# Patient Record
Sex: Male | Born: 1940 | Race: White | Hispanic: No | Marital: Married | Smoking: Former smoker
Health system: Southern US, Community
[De-identification: ages and names within clinical notes are randomized; demographics above are authoritative.]

## PROBLEM LIST (undated history)

## (undated) DIAGNOSIS — I10 Essential (primary) hypertension: Secondary | ICD-10-CM

## (undated) DIAGNOSIS — N320 Bladder-neck obstruction: Secondary | ICD-10-CM

## (undated) DIAGNOSIS — K219 Gastro-esophageal reflux disease without esophagitis: Secondary | ICD-10-CM

## (undated) DIAGNOSIS — I739 Peripheral vascular disease, unspecified: Secondary | ICD-10-CM

## (undated) DIAGNOSIS — J45909 Unspecified asthma, uncomplicated: Secondary | ICD-10-CM

## (undated) DIAGNOSIS — Z9889 Other specified postprocedural states: Secondary | ICD-10-CM

## (undated) DIAGNOSIS — N183 Chronic kidney disease, stage 3 unspecified: Secondary | ICD-10-CM

## (undated) DIAGNOSIS — I429 Cardiomyopathy, unspecified: Secondary | ICD-10-CM

## (undated) DIAGNOSIS — C159 Malignant neoplasm of esophagus, unspecified: Secondary | ICD-10-CM

## (undated) DIAGNOSIS — E119 Type 2 diabetes mellitus without complications: Secondary | ICD-10-CM

## (undated) DIAGNOSIS — D649 Anemia, unspecified: Secondary | ICD-10-CM

## (undated) DIAGNOSIS — M109 Gout, unspecified: Secondary | ICD-10-CM

## (undated) DIAGNOSIS — Z515 Encounter for palliative care: Secondary | ICD-10-CM

## (undated) DIAGNOSIS — E785 Hyperlipidemia, unspecified: Secondary | ICD-10-CM

## (undated) DIAGNOSIS — Z889 Allergy status to unspecified drugs, medicaments and biological substances status: Secondary | ICD-10-CM

## (undated) HISTORY — DX: Anemia, unspecified: D64.9

## (undated) HISTORY — DX: Allergy status to unspecified drugs, medicaments and biological substances: Z88.9

## (undated) HISTORY — DX: Malignant neoplasm of esophagus, unspecified: C15.9

## (undated) HISTORY — DX: Bladder-neck obstruction: N32.0

## (undated) HISTORY — DX: Peripheral vascular disease, unspecified: I73.9

## (undated) HISTORY — DX: Chronic kidney disease, stage 3 (moderate): N18.3

## (undated) HISTORY — DX: Hyperlipidemia, unspecified: E78.5

## (undated) HISTORY — DX: Other specified postprocedural states: Z98.890

## (undated) HISTORY — DX: Encounter for palliative care: Z51.5

## (undated) HISTORY — DX: Cardiomyopathy, unspecified: I42.9

## (undated) HISTORY — DX: Type 2 diabetes mellitus without complications: E11.9

## (undated) HISTORY — DX: Chronic kidney disease, stage 3 unspecified: N18.30

---

## 1963-07-25 HISTORY — PX: OTHER SURGICAL HISTORY: SHX169

## 1996-07-24 HISTORY — PX: ABDOMINAL AORTIC ANEURYSM REPAIR: SUR1152

## 2000-07-24 HISTORY — PX: HERNIA REPAIR: SHX51

## 2005-04-27 ENCOUNTER — Ambulatory Visit: Payer: Self-pay | Admitting: Internal Medicine

## 2006-07-24 HISTORY — PX: CATARACT EXTRACTION: SUR2

## 2010-12-01 ENCOUNTER — Ambulatory Visit: Payer: Self-pay | Admitting: Gastroenterology

## 2010-12-05 LAB — PATHOLOGY REPORT

## 2011-02-07 ENCOUNTER — Ambulatory Visit: Payer: Self-pay | Admitting: General Surgery

## 2011-02-09 ENCOUNTER — Other Ambulatory Visit: Payer: Self-pay | Admitting: General Surgery

## 2011-02-16 ENCOUNTER — Ambulatory Visit: Payer: Self-pay | Admitting: General Surgery

## 2011-02-21 ENCOUNTER — Ambulatory Visit: Payer: Self-pay | Admitting: General Surgery

## 2011-07-25 LAB — HM COLONOSCOPY

## 2011-08-25 ENCOUNTER — Ambulatory Visit: Payer: Self-pay | Admitting: General Surgery

## 2011-08-28 LAB — PATHOLOGY REPORT

## 2011-12-27 ENCOUNTER — Ambulatory Visit: Payer: Self-pay | Admitting: Nephrology

## 2012-01-16 DIAGNOSIS — C159 Malignant neoplasm of esophagus, unspecified: Secondary | ICD-10-CM | POA: Insufficient documentation

## 2012-01-17 ENCOUNTER — Ambulatory Visit: Payer: Self-pay | Admitting: General Surgery

## 2012-01-18 LAB — PATHOLOGY REPORT

## 2012-01-19 ENCOUNTER — Ambulatory Visit: Payer: Self-pay | Admitting: Oncology

## 2012-01-26 ENCOUNTER — Ambulatory Visit: Payer: Self-pay | Admitting: General Surgery

## 2012-01-29 ENCOUNTER — Ambulatory Visit: Payer: Self-pay | Admitting: Oncology

## 2012-01-29 LAB — CBC CANCER CENTER
Basophil #: 0.1 x10 3/mm (ref 0.0–0.1)
Basophil %: 0.6 %
HCT: 42.4 % (ref 40.0–52.0)
HGB: 14.3 g/dL (ref 13.0–18.0)
Lymphocyte #: 1 x10 3/mm (ref 1.0–3.6)
Lymphocyte %: 11.3 %
MCHC: 33.7 g/dL (ref 32.0–36.0)
MCV: 82 fL (ref 80–100)
Monocyte #: 0.9 x10 3/mm (ref 0.2–1.0)
Monocyte %: 9.7 %
Neutrophil #: 7 x10 3/mm — ABNORMAL HIGH (ref 1.4–6.5)
RDW: 16 % — ABNORMAL HIGH (ref 11.5–14.5)
WBC: 9.2 x10 3/mm (ref 3.8–10.6)

## 2012-01-29 LAB — COMPREHENSIVE METABOLIC PANEL
Alkaline Phosphatase: 106 U/L (ref 50–136)
Calcium, Total: 9.8 mg/dL (ref 8.5–10.1)
Chloride: 103 mmol/L (ref 98–107)
Co2: 27 mmol/L (ref 21–32)
EGFR (African American): 49 — ABNORMAL LOW
EGFR (Non-African Amer.): 42 — ABNORMAL LOW
Osmolality: 282 (ref 275–301)
SGOT(AST): 34 U/L (ref 15–37)
SGPT (ALT): 49 U/L
Sodium: 139 mmol/L (ref 136–145)

## 2012-01-29 LAB — PROTIME-INR: INR: 0.9

## 2012-01-30 ENCOUNTER — Telehealth: Payer: Self-pay | Admitting: Gastroenterology

## 2012-01-30 DIAGNOSIS — C159 Malignant neoplasm of esophagus, unspecified: Secondary | ICD-10-CM

## 2012-01-30 NOTE — Telephone Encounter (Signed)
Pt to be scheduled for 02/15/12 message left with Dahlia Client to be sure that date will work.  Dr Christella Hartigan is out of the office next week

## 2012-01-30 NOTE — Telephone Encounter (Signed)
Jesse Huynh states the 25 th will be fine for the procedure I will get this scheduled and call the pt

## 2012-01-31 ENCOUNTER — Other Ambulatory Visit: Payer: Self-pay

## 2012-01-31 NOTE — Telephone Encounter (Signed)
Unable to reach pt home number is not accepting calls and no answer on the cell phone

## 2012-01-31 NOTE — Addendum Note (Signed)
Addended by: Donata Duff on: 01/31/2012 11:24 AM   Modules accepted: Orders

## 2012-02-01 NOTE — Telephone Encounter (Signed)
Jesse Huynh gave me an alternate number to use [phone])

## 2012-02-01 NOTE — Telephone Encounter (Signed)
No answer instructions have been mailed

## 2012-02-01 NOTE — Telephone Encounter (Signed)
Pt has been notified and meds reviewed phone number was corrected in EPIC as well as address

## 2012-02-15 ENCOUNTER — Ambulatory Visit (HOSPITAL_COMMUNITY)
Admission: RE | Admit: 2012-02-15 | Discharge: 2012-02-15 | Disposition: A | Discharge status: Home / Self Care | Payer: Medicare Other | Source: Ambulatory Visit | Attending: Gastroenterology | Admitting: Gastroenterology

## 2012-02-15 ENCOUNTER — Encounter (HOSPITAL_COMMUNITY): Payer: Self-pay | Admitting: Gastroenterology

## 2012-02-15 ENCOUNTER — Encounter (HOSPITAL_COMMUNITY)
Admission: RE | Disposition: A | Discharge status: Home / Self Care | Payer: Self-pay | Source: Ambulatory Visit | Attending: Gastroenterology

## 2012-02-15 DIAGNOSIS — C159 Malignant neoplasm of esophagus, unspecified: Secondary | ICD-10-CM

## 2012-02-15 DIAGNOSIS — C153 Malignant neoplasm of upper third of esophagus: Secondary | ICD-10-CM | POA: Insufficient documentation

## 2012-02-15 DIAGNOSIS — E119 Type 2 diabetes mellitus without complications: Secondary | ICD-10-CM | POA: Insufficient documentation

## 2012-02-15 DIAGNOSIS — I10 Essential (primary) hypertension: Secondary | ICD-10-CM | POA: Insufficient documentation

## 2012-02-15 DIAGNOSIS — K219 Gastro-esophageal reflux disease without esophagitis: Secondary | ICD-10-CM | POA: Insufficient documentation

## 2012-02-15 HISTORY — DX: Unspecified asthma, uncomplicated: J45.909

## 2012-02-15 HISTORY — DX: Essential (primary) hypertension: I10

## 2012-02-15 HISTORY — PX: EUS: SHX5427

## 2012-02-15 HISTORY — DX: Gout, unspecified: M10.9

## 2012-02-15 HISTORY — DX: Gastro-esophageal reflux disease without esophagitis: K21.9

## 2012-02-15 SURGERY — UPPER ENDOSCOPIC ULTRASOUND (EUS) RADIAL
Anesthesia: Moderate Sedation

## 2012-02-15 MED ORDER — FENTANYL CITRATE 0.05 MG/ML IJ SOLN
INTRAMUSCULAR | Status: AC
  Filled 2012-02-15: qty 4

## 2012-02-15 MED ORDER — MIDAZOLAM HCL 10 MG/2ML IJ SOLN
INTRAMUSCULAR | Status: AC
  Filled 2012-02-15: qty 4

## 2012-02-15 MED ORDER — MIDAZOLAM HCL 10 MG/2ML IJ SOLN
INTRAMUSCULAR | Status: DC | PRN
  Administered 2012-02-15 (×4): 2 mg via INTRAVENOUS

## 2012-02-15 MED ORDER — SODIUM CHLORIDE 0.9 % IV SOLN
INTRAVENOUS | Status: DC
  Administered 2012-02-15: 500 mL via INTRAVENOUS

## 2012-02-15 MED ORDER — FENTANYL CITRATE 0.05 MG/ML IJ SOLN
INTRAMUSCULAR | Status: DC | PRN
  Administered 2012-02-15 (×4): 25 ug via INTRAVENOUS

## 2012-02-15 MED ORDER — BUTAMBEN-TETRACAINE-BENZOCAINE 2-2-14 % EX AERO
INHALATION_SPRAY | CUTANEOUS | Status: DC | PRN
  Administered 2012-02-15: 2 via TOPICAL

## 2012-02-15 MED ORDER — DIPHENHYDRAMINE HCL 50 MG/ML IJ SOLN
INTRAMUSCULAR | Status: AC
  Filled 2012-02-15: qty 1

## 2012-02-15 NOTE — Op Note (Signed)
[facility] [address] [place], [place]  [address]  ENDOSCOPIC ULTRASOUND PROCEDURE REPORT  PATIENT:  Jesse Huynh, Jesse Huynh  MR#:  [id number] BIRTHDATE:  08/04/1940  GENDER:  male ENDOSCOPIST:  Rachael Fee, MD REFERRED BY:  Sallee Lange, M.D. 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 The patient was then placed in the left, lateral, decubitus postion and IV sedation was administered. Throughout the procedure, the patient's blood pressure, pulse and oxygen saturations were monitored continuously.  Under direct visualization, the [id number] endoscope was introduced through the mouth and advanced to the second portion of the duodenum.  Water was used as necessary to provide an acoustic interface.  Upon completion of the imaging, water was removed and the patient was sent to the recovery room in satisfactory condition. <<PROCEDUREIMAGES>> Endoscopic findings: 1. The mucosa in proximal esophagus was  circumferentially friable, and slightly ulcerated from 18cm to 20cm from incisors however there was no discrete mass lesion. 2. The esophagus and stomach were otherwise normal.  EUS findings: 1. The wall of the esophgus at the level of the abnormal mucosa above was normal by EUS.  There were no clear masses (uT1 at most) 2. Adjacent to the abnormal mucosa there was a 6.83mm, round, discrete, homogeneous, hypochoic lymphnode that was suspicious for malignant invovlement (uN1). 3. No other mediastinal, paraesophageal or celiac adenopathy.  Impression: uT1(at most)N1 proximal esophageal squamous cell cancer located from 18-20cm from incisors.  The  single suspicious paraesophageal lymphnode is directly adjacent the the primary tumor.  ______________________________ Rachael Fee, MD  n. eSIGNED:   Rachael Fee at 02/15/2012 12:31 PM  Neva Seat, [phone]

## 2012-02-15 NOTE — H&P (Signed)
  HPI: This is a man recently diagnosed with proximal esophageal squamous cancer.  PET shows no sign of metastatic disease    Past Medical History  Diagnosis Date  . Hypertension   . Asthma   . Diabetes mellitus   . GERD (gastroesophageal reflux disease)   . Cancer 01-16-2012  . Gout   . High cholesterol     Past Surgical History  Procedure Date  . Abdominal aortic aneurysm repair 1998  . Hernia repair 2012    abdmoninal  . Knee sx 1965    left    Current Facility-Administered Medications  Medication Dose Route Frequency Provider Last Rate Last Dose  . 0.9 %  sodium chloride infusion   Intravenous Continuous Rachael Fee, MD 20 mL/hr at 02/15/12 1146 500 mL at 02/15/12 1146    Allergies as of 01/31/2012  . (Not on File)    History reviewed. No pertinent family history.  History   Social History  . Marital Status: Married    Spouse Name: N/A    Number of Children: N/A  . Years of Education: N/A   Occupational History  . Not on file.   Social History Main Topics  . Smoking status: Former Games developer  . Smokeless tobacco: Not on file  . Alcohol Use: No  . Drug Use: No  . Sexually Active:    Other Topics Concern  . Not on file   Social History Narrative  . No narrative on file      Physical Exam: BP 137/78  Pulse 75  Temp 97.8 F (36.6 C) (Oral)  Resp 25  Ht 6' (1.829 m)  Wt 186 lb (84.369 kg)  BMI 25.23 kg/m2  SpO2 95% Constitutional: generally well-appearing Psychiatric: alert and oriented x3 Abdomen: soft, nontender, nondistended, no obvious ascites, no peritoneal signs, normal bowel sounds     Assessment and plan: 71 y.o. male with esophaegal cancer  For EUS staging today

## 2012-02-16 ENCOUNTER — Encounter (HOSPITAL_COMMUNITY): Payer: Self-pay

## 2012-02-16 ENCOUNTER — Encounter (HOSPITAL_COMMUNITY): Payer: Self-pay | Admitting: Gastroenterology

## 2012-02-21 ENCOUNTER — Ambulatory Visit: Payer: Self-pay | Admitting: General Surgery

## 2012-02-22 ENCOUNTER — Ambulatory Visit: Payer: Self-pay | Admitting: General Surgery

## 2012-02-22 ENCOUNTER — Ambulatory Visit: Payer: Self-pay | Admitting: Oncology

## 2012-02-23 ENCOUNTER — Ambulatory Visit: Payer: Self-pay | Admitting: Oncology

## 2012-02-26 LAB — COMPREHENSIVE METABOLIC PANEL
Albumin: 3.3 g/dL — ABNORMAL LOW (ref 3.4–5.0)
Alkaline Phosphatase: 132 U/L (ref 50–136)
Anion Gap: 13 (ref 7–16)
BUN: 19 mg/dL — ABNORMAL HIGH (ref 7–18)
Bilirubin,Total: 0.3 mg/dL (ref 0.2–1.0)
Chloride: 101 mmol/L (ref 98–107)
Creatinine: 1.49 mg/dL — ABNORMAL HIGH (ref 0.60–1.30)
EGFR (African American): 54 — ABNORMAL LOW
Glucose: 253 mg/dL — ABNORMAL HIGH (ref 65–99)
SGOT(AST): 25 U/L (ref 15–37)
SGPT (ALT): 45 U/L (ref 12–78)
Sodium: 137 mmol/L (ref 136–145)
Total Protein: 7.1 g/dL (ref 6.4–8.2)

## 2012-02-26 LAB — CBC CANCER CENTER
Basophil %: 0.4 %
Eosinophil #: 0.2 x10 3/mm (ref 0.0–0.7)
HCT: 37.1 % — ABNORMAL LOW (ref 40.0–52.0)
HGB: 12.4 g/dL — ABNORMAL LOW (ref 13.0–18.0)
Lymphocyte %: 10 %
MCHC: 33.5 g/dL (ref 32.0–36.0)
MCV: 83 fL (ref 80–100)
Monocyte %: 9.2 %
Neutrophil #: 6.5 x10 3/mm (ref 1.4–6.5)
Neutrophil %: 78 %
RDW: 15.2 % — ABNORMAL HIGH (ref 11.5–14.5)

## 2012-03-04 LAB — COMPREHENSIVE METABOLIC PANEL
Albumin: 3.5 g/dL (ref 3.4–5.0)
Alkaline Phosphatase: 102 U/L (ref 50–136)
BUN: 37 mg/dL — ABNORMAL HIGH (ref 7–18)
Bilirubin,Total: 0.3 mg/dL (ref 0.2–1.0)
Calcium, Total: 9.3 mg/dL (ref 8.5–10.1)
Co2: 24 mmol/L (ref 21–32)
Creatinine: 1.93 mg/dL — ABNORMAL HIGH (ref 0.60–1.30)
EGFR (African American): 39 — ABNORMAL LOW
EGFR (Non-African Amer.): 34 — ABNORMAL LOW
Glucose: 253 mg/dL — ABNORMAL HIGH (ref 65–99)
Osmolality: 293 (ref 275–301)
Potassium: 4.5 mmol/L (ref 3.5–5.1)
SGPT (ALT): 47 U/L (ref 12–78)
Total Protein: 7.3 g/dL (ref 6.4–8.2)

## 2012-03-04 LAB — CBC CANCER CENTER
Eosinophil #: 0.1 x10 3/mm (ref 0.0–0.7)
Eosinophil %: 1.6 %
HCT: 37 % — ABNORMAL LOW (ref 40.0–52.0)
Lymphocyte #: 0.7 x10 3/mm — ABNORMAL LOW (ref 1.0–3.6)
MCHC: 33.6 g/dL (ref 32.0–36.0)
MCV: 84 fL (ref 80–100)
Monocyte #: 0.6 x10 3/mm (ref 0.2–1.0)
Monocyte %: 8 %
Neutrophil #: 5.6 x10 3/mm (ref 1.4–6.5)
Platelet: 135 x10 3/mm — ABNORMAL LOW (ref 150–440)
RBC: 4.43 10*6/uL (ref 4.40–5.90)
RDW: 15 % — ABNORMAL HIGH (ref 11.5–14.5)
WBC: 7 x10 3/mm (ref 3.8–10.6)

## 2012-03-11 LAB — CBC CANCER CENTER
Basophil #: 0 x10 3/mm (ref 0.0–0.1)
Eosinophil #: 0.1 x10 3/mm (ref 0.0–0.7)
HCT: 36.4 % — ABNORMAL LOW (ref 40.0–52.0)
HGB: 11.9 g/dL — ABNORMAL LOW (ref 13.0–18.0)
Lymphocyte #: 0.4 x10 3/mm — ABNORMAL LOW (ref 1.0–3.6)
MCHC: 32.7 g/dL (ref 32.0–36.0)
MCV: 84 fL (ref 80–100)
Monocyte %: 7.9 %
Neutrophil #: 4.7 x10 3/mm (ref 1.4–6.5)
Neutrophil %: 83.5 %
RDW: 14.7 % — ABNORMAL HIGH (ref 11.5–14.5)
WBC: 5.7 x10 3/mm (ref 3.8–10.6)

## 2012-03-11 LAB — COMPREHENSIVE METABOLIC PANEL
Albumin: 3.4 g/dL (ref 3.4–5.0)
Anion Gap: 8 (ref 7–16)
BUN: 27 mg/dL — ABNORMAL HIGH (ref 7–18)
Bilirubin,Total: 0.4 mg/dL (ref 0.2–1.0)
Chloride: 108 mmol/L — ABNORMAL HIGH (ref 98–107)
Creatinine: 1.56 mg/dL — ABNORMAL HIGH (ref 0.60–1.30)
EGFR (African American): 51 — ABNORMAL LOW
EGFR (Non-African Amer.): 44 — ABNORMAL LOW
Glucose: 230 mg/dL — ABNORMAL HIGH (ref 65–99)
Osmolality: 292 (ref 275–301)
Potassium: 4.2 mmol/L (ref 3.5–5.1)
SGOT(AST): 30 U/L (ref 15–37)
SGPT (ALT): 38 U/L (ref 12–78)
Sodium: 140 mmol/L (ref 136–145)
Total Protein: 7.3 g/dL (ref 6.4–8.2)

## 2012-03-18 LAB — CBC CANCER CENTER
Basophil %: 0.4 %
Eosinophil #: 0 x10 3/mm (ref 0.0–0.7)
HGB: 11.2 g/dL — ABNORMAL LOW (ref 13.0–18.0)
MCH: 27.5 pg (ref 26.0–34.0)
MCHC: 32.7 g/dL (ref 32.0–36.0)
Monocyte #: 0.5 x10 3/mm (ref 0.2–1.0)
Neutrophil %: 83.4 %
Platelet: 86 x10 3/mm — ABNORMAL LOW (ref 150–440)
RDW: 15.2 % — ABNORMAL HIGH (ref 11.5–14.5)

## 2012-03-18 LAB — COMPREHENSIVE METABOLIC PANEL
Alkaline Phosphatase: 98 U/L (ref 50–136)
Anion Gap: 11 (ref 7–16)
BUN: 28 mg/dL — ABNORMAL HIGH (ref 7–18)
Calcium, Total: 9 mg/dL (ref 8.5–10.1)
Co2: 24 mmol/L (ref 21–32)
EGFR (Non-African Amer.): 38 — ABNORMAL LOW
Glucose: 274 mg/dL — ABNORMAL HIGH (ref 65–99)
Osmolality: 291 (ref 275–301)
Sodium: 138 mmol/L (ref 136–145)

## 2012-03-24 ENCOUNTER — Ambulatory Visit: Payer: Self-pay | Admitting: Oncology

## 2012-03-26 LAB — CBC CANCER CENTER
Basophil #: 0 x10 3/mm (ref 0.0–0.1)
Basophil %: 0.4 %
Eosinophil #: 0 x10 3/mm (ref 0.0–0.7)
Eosinophil %: 0.5 %
HCT: 33.2 % — ABNORMAL LOW (ref 40.0–52.0)
Lymphocyte #: 0.3 x10 3/mm — ABNORMAL LOW (ref 1.0–3.6)
Lymphocyte %: 4.7 %
MCH: 27.4 pg (ref 26.0–34.0)
MCHC: 32.5 g/dL (ref 32.0–36.0)
Monocyte #: 0.6 x10 3/mm (ref 0.2–1.0)
Monocyte %: 9.8 %
Neutrophil #: 5 x10 3/mm (ref 1.4–6.5)
Neutrophil %: 84.6 %
Platelet: 60 x10 3/mm — ABNORMAL LOW (ref 150–440)
RDW: 16.6 % — ABNORMAL HIGH (ref 11.5–14.5)

## 2012-03-26 LAB — COMPREHENSIVE METABOLIC PANEL
Alkaline Phosphatase: 94 U/L (ref 50–136)
Bilirubin,Total: 0.3 mg/dL (ref 0.2–1.0)
Co2: 23 mmol/L (ref 21–32)
Creatinine: 1.71 mg/dL — ABNORMAL HIGH (ref 0.60–1.30)
EGFR (Non-African Amer.): 39 — ABNORMAL LOW
Osmolality: 288 (ref 275–301)
Potassium: 4.5 mmol/L (ref 3.5–5.1)
SGPT (ALT): 31 U/L (ref 12–78)
Sodium: 139 mmol/L (ref 136–145)
Total Protein: 6.9 g/dL (ref 6.4–8.2)

## 2012-04-01 LAB — CBC CANCER CENTER
Basophil #: 0 x10 3/mm (ref 0.0–0.1)
Basophil %: 0.3 %
Eosinophil %: 1.1 %
HCT: 31.1 % — ABNORMAL LOW (ref 40.0–52.0)
HGB: 10.3 g/dL — ABNORMAL LOW (ref 13.0–18.0)
Lymphocyte #: 0.2 x10 3/mm — ABNORMAL LOW (ref 1.0–3.6)
Lymphocyte %: 4 %
MCV: 85 fL (ref 80–100)
Monocyte %: 9.9 %
Neutrophil #: 4.3 x10 3/mm (ref 1.4–6.5)
Neutrophil %: 84.7 %
Platelet: 56 x10 3/mm — ABNORMAL LOW (ref 150–440)
RBC: 3.68 10*6/uL — ABNORMAL LOW (ref 4.40–5.90)
RDW: 17.6 % — ABNORMAL HIGH (ref 11.5–14.5)

## 2012-04-01 LAB — COMPREHENSIVE METABOLIC PANEL
Albumin: 3.2 g/dL — ABNORMAL LOW (ref 3.4–5.0)
Anion Gap: 10 (ref 7–16)
Bilirubin,Total: 0.3 mg/dL (ref 0.2–1.0)
Calcium, Total: 8.7 mg/dL (ref 8.5–10.1)
Co2: 23 mmol/L (ref 21–32)
EGFR (Non-African Amer.): 41 — ABNORMAL LOW
Osmolality: 287 (ref 275–301)
Potassium: 4.1 mmol/L (ref 3.5–5.1)
SGOT(AST): 21 U/L (ref 15–37)
Sodium: 138 mmol/L (ref 136–145)

## 2012-04-08 LAB — CBC CANCER CENTER
Basophil %: 0.5 %
Eosinophil %: 2.5 %
HGB: 10.2 g/dL — ABNORMAL LOW (ref 13.0–18.0)
Lymphocyte #: 0.3 x10 3/mm — ABNORMAL LOW (ref 1.0–3.6)
MCH: 28.4 pg (ref 26.0–34.0)
Monocyte #: 0.6 x10 3/mm (ref 0.2–1.0)
Monocyte %: 13.2 %
Neutrophil #: 3.3 x10 3/mm (ref 1.4–6.5)
Platelet: 59 x10 3/mm — ABNORMAL LOW (ref 150–440)
RBC: 3.6 10*6/uL — ABNORMAL LOW (ref 4.40–5.90)
WBC: 4.3 x10 3/mm (ref 3.8–10.6)

## 2012-04-08 LAB — COMPREHENSIVE METABOLIC PANEL
Alkaline Phosphatase: 93 U/L (ref 50–136)
Bilirubin,Total: 0.3 mg/dL (ref 0.2–1.0)
Calcium, Total: 8.7 mg/dL (ref 8.5–10.1)
Chloride: 103 mmol/L (ref 98–107)
Co2: 26 mmol/L (ref 21–32)
Creatinine: 1.63 mg/dL — ABNORMAL HIGH (ref 0.60–1.30)
Osmolality: 289 (ref 275–301)
Potassium: 4 mmol/L (ref 3.5–5.1)
Sodium: 139 mmol/L (ref 136–145)

## 2012-04-15 LAB — COMPREHENSIVE METABOLIC PANEL
Anion Gap: 8 (ref 7–16)
Bilirubin,Total: 0.3 mg/dL (ref 0.2–1.0)
Calcium, Total: 9 mg/dL (ref 8.5–10.1)
Creatinine: 1.56 mg/dL — ABNORMAL HIGH (ref 0.60–1.30)
EGFR (African American): 51 — ABNORMAL LOW
EGFR (Non-African Amer.): 44 — ABNORMAL LOW
Glucose: 272 mg/dL — ABNORMAL HIGH (ref 65–99)
Potassium: 4 mmol/L (ref 3.5–5.1)
Sodium: 136 mmol/L (ref 136–145)
Total Protein: 6.4 g/dL (ref 6.4–8.2)

## 2012-04-15 LAB — CBC CANCER CENTER
Basophil #: 0 x10 3/mm (ref 0.0–0.1)
Basophil %: 0.1 %
Eosinophil #: 0.1 x10 3/mm (ref 0.0–0.7)
Eosinophil %: 1 %
HCT: 31.3 % — ABNORMAL LOW (ref 40.0–52.0)
HGB: 10.4 g/dL — ABNORMAL LOW (ref 13.0–18.0)
Lymphocyte #: 0.2 x10 3/mm — ABNORMAL LOW (ref 1.0–3.6)
Lymphocyte %: 4.2 %
MCH: 28.7 pg (ref 26.0–34.0)
MCV: 86 fL (ref 80–100)
Monocyte #: 0.4 x10 3/mm (ref 0.2–1.0)
RBC: 3.63 10*6/uL — ABNORMAL LOW (ref 4.40–5.90)
WBC: 5.1 x10 3/mm (ref 3.8–10.6)

## 2012-04-22 LAB — CBC CANCER CENTER
Basophil #: 0 x10 3/mm (ref 0.0–0.1)
Basophil %: 0.3 %
Eosinophil #: 0 x10 3/mm (ref 0.0–0.7)
Eosinophil %: 0.7 %
HGB: 10.5 g/dL — ABNORMAL LOW (ref 13.0–18.0)
Lymphocyte #: 0.2 x10 3/mm — ABNORMAL LOW (ref 1.0–3.6)
Lymphocyte %: 4.5 %
MCH: 28.7 pg (ref 26.0–34.0)
MCV: 87 fL (ref 80–100)
Monocyte #: 0.5 x10 3/mm (ref 0.2–1.0)
Monocyte %: 11 %
Platelet: 90 x10 3/mm — ABNORMAL LOW (ref 150–440)
RBC: 3.64 10*6/uL — ABNORMAL LOW (ref 4.40–5.90)
WBC: 4.4 x10 3/mm (ref 3.8–10.6)

## 2012-04-22 LAB — COMPREHENSIVE METABOLIC PANEL
Albumin: 3.3 g/dL — ABNORMAL LOW (ref 3.4–5.0)
BUN: 21 mg/dL — ABNORMAL HIGH (ref 7–18)
Calcium, Total: 9.2 mg/dL (ref 8.5–10.1)
Chloride: 102 mmol/L (ref 98–107)
Co2: 22 mmol/L (ref 21–32)
EGFR (African American): 53 — ABNORMAL LOW
Glucose: 265 mg/dL — ABNORMAL HIGH (ref 65–99)
Potassium: 4.4 mmol/L (ref 3.5–5.1)
SGOT(AST): 21 U/L (ref 15–37)
SGPT (ALT): 33 U/L (ref 12–78)
Sodium: 138 mmol/L (ref 136–145)
Total Protein: 6.7 g/dL (ref 6.4–8.2)

## 2012-04-23 ENCOUNTER — Ambulatory Visit: Payer: Self-pay | Admitting: Oncology

## 2012-05-22 LAB — CBC CANCER CENTER
Basophil #: 0 x10 3/mm (ref 0.0–0.1)
Basophil %: 0.4 %
Eosinophil %: 2.6 %
HCT: 31.5 % — ABNORMAL LOW (ref 40.0–52.0)
Lymphocyte #: 0.4 x10 3/mm — ABNORMAL LOW (ref 1.0–3.6)
Lymphocyte %: 6 %
MCH: 30.4 pg (ref 26.0–34.0)
MCV: 89 fL (ref 80–100)
Monocyte #: 0.6 x10 3/mm (ref 0.2–1.0)
Monocyte %: 10 %
Platelet: 74 x10 3/mm — ABNORMAL LOW (ref 150–440)
RBC: 3.53 10*6/uL — ABNORMAL LOW (ref 4.40–5.90)
WBC: 6.4 x10 3/mm (ref 3.8–10.6)

## 2012-05-24 ENCOUNTER — Ambulatory Visit: Payer: Self-pay | Admitting: Oncology

## 2012-06-19 ENCOUNTER — Ambulatory Visit: Payer: Self-pay | Admitting: General Surgery

## 2012-06-24 ENCOUNTER — Ambulatory Visit: Payer: Self-pay | Admitting: Oncology

## 2012-06-26 ENCOUNTER — Ambulatory Visit: Payer: Self-pay | Admitting: Oncology

## 2012-06-26 LAB — CBC CANCER CENTER
Basophil #: 0 x10 3/mm (ref 0.0–0.1)
Eosinophil #: 0.2 x10 3/mm (ref 0.0–0.7)
Eosinophil %: 2.9 %
HGB: 11.9 g/dL — ABNORMAL LOW (ref 13.0–18.0)
Lymphocyte %: 5.6 %
MCH: 30.9 pg (ref 26.0–34.0)
MCHC: 34.6 g/dL (ref 32.0–36.0)
Monocyte #: 0.7 x10 3/mm (ref 0.2–1.0)
Neutrophil %: 81.1 %
Platelet: 92 x10 3/mm — ABNORMAL LOW (ref 150–440)

## 2012-07-24 ENCOUNTER — Ambulatory Visit: Payer: Self-pay | Admitting: Oncology

## 2012-08-13 LAB — BASIC METABOLIC PANEL: Creat: 1.4

## 2012-08-13 LAB — CBC: platelet count: 110

## 2012-08-24 ENCOUNTER — Ambulatory Visit: Payer: Self-pay | Admitting: Oncology

## 2012-09-21 ENCOUNTER — Ambulatory Visit: Payer: Self-pay | Admitting: Oncology

## 2012-10-02 ENCOUNTER — Ambulatory Visit: Payer: Self-pay | Admitting: Oncology

## 2012-10-22 ENCOUNTER — Ambulatory Visit: Payer: Self-pay | Admitting: Oncology

## 2012-10-23 LAB — CBC CANCER CENTER
Basophil #: 0.1 x10 3/mm (ref 0.0–0.1)
Basophil %: 0.8 %
Eosinophil %: 2.6 %
Lymphocyte #: 0.6 x10 3/mm — ABNORMAL LOW (ref 1.0–3.6)
MCV: 82 fL (ref 80–100)
Monocyte #: 1 x10 3/mm (ref 0.2–1.0)
Neutrophil #: 7.5 x10 3/mm — ABNORMAL HIGH (ref 1.4–6.5)
Platelet: 111 x10 3/mm — ABNORMAL LOW (ref 150–440)
RBC: 4.87 10*6/uL (ref 4.40–5.90)
WBC: 9.4 x10 3/mm (ref 3.8–10.6)

## 2012-10-23 LAB — COMPREHENSIVE METABOLIC PANEL
Albumin: 3.8 g/dL (ref 3.4–5.0)
Anion Gap: 11 (ref 7–16)
BUN: 25 mg/dL — ABNORMAL HIGH (ref 7–18)
Calcium, Total: 9.1 mg/dL (ref 8.5–10.1)
Chloride: 104 mmol/L (ref 98–107)
Co2: 24 mmol/L (ref 21–32)
Creatinine: 1.8 mg/dL — ABNORMAL HIGH (ref 0.60–1.30)
EGFR (African American): 43 — ABNORMAL LOW
Osmolality: 290 (ref 275–301)
Potassium: 4.7 mmol/L (ref 3.5–5.1)
SGOT(AST): 24 U/L (ref 15–37)
Sodium: 139 mmol/L (ref 136–145)
Total Protein: 7.6 g/dL (ref 6.4–8.2)

## 2012-11-12 LAB — HEPATIC FUNCTION PANEL
ALT: 22 U/L (ref 10–40)
Albumin: 4
Bilirubin, Direct: 0.1 mg/dL (ref 0.01–0.4)

## 2012-11-12 LAB — BASIC METABOLIC PANEL
Calcium: 9.6 mg/dL
Creat: 1.4
Glucose: 158

## 2012-11-12 LAB — LIPID PANEL
Cholesterol: 110 mg/dL (ref 0–200)
Direct LDL: 26.8
LDL (calc): 47.4
Triglycerides: 179

## 2012-11-19 LAB — HEMOGLOBIN A1C: A1c: 6.9

## 2012-11-19 LAB — URIC ACID: Uric Acid: 7.5

## 2012-11-21 ENCOUNTER — Ambulatory Visit: Payer: Self-pay | Admitting: Oncology

## 2012-12-24 ENCOUNTER — Ambulatory Visit: Payer: Self-pay | Admitting: Oncology

## 2013-01-21 ENCOUNTER — Ambulatory Visit: Payer: Self-pay | Admitting: Oncology

## 2013-01-21 LAB — HM DIABETES FOOT EXAM

## 2013-02-21 ENCOUNTER — Ambulatory Visit: Payer: Self-pay | Admitting: Oncology

## 2013-02-24 ENCOUNTER — Ambulatory Visit (INDEPENDENT_AMBULATORY_CARE_PROVIDER_SITE_OTHER): Payer: Medicare Other | Admitting: Family Medicine

## 2013-02-24 ENCOUNTER — Encounter: Payer: Self-pay | Admitting: Family Medicine

## 2013-02-24 VITALS — BP 150/80 | HR 64 | Temp 98.3°F | Ht 72.0 in | Wt 183.5 lb

## 2013-02-24 DIAGNOSIS — Z9889 Other specified postprocedural states: Secondary | ICD-10-CM

## 2013-02-24 DIAGNOSIS — Z23 Encounter for immunization: Secondary | ICD-10-CM

## 2013-02-24 DIAGNOSIS — Z8501 Personal history of malignant neoplasm of esophagus: Secondary | ICD-10-CM

## 2013-02-24 DIAGNOSIS — J4520 Mild intermittent asthma, uncomplicated: Secondary | ICD-10-CM

## 2013-02-24 DIAGNOSIS — E785 Hyperlipidemia, unspecified: Secondary | ICD-10-CM | POA: Insufficient documentation

## 2013-02-24 DIAGNOSIS — I10 Essential (primary) hypertension: Secondary | ICD-10-CM

## 2013-02-24 DIAGNOSIS — E119 Type 2 diabetes mellitus without complications: Secondary | ICD-10-CM

## 2013-02-24 DIAGNOSIS — M109 Gout, unspecified: Secondary | ICD-10-CM

## 2013-02-24 DIAGNOSIS — J45909 Unspecified asthma, uncomplicated: Secondary | ICD-10-CM

## 2013-02-24 DIAGNOSIS — K219 Gastro-esophageal reflux disease without esophagitis: Secondary | ICD-10-CM

## 2013-02-24 DIAGNOSIS — E1165 Type 2 diabetes mellitus with hyperglycemia: Secondary | ICD-10-CM | POA: Insufficient documentation

## 2013-02-24 MED ORDER — METFORMIN HCL 500 MG PO TABS: 500.0000 mg | ORAL_TABLET | Freq: Two times a day (BID) | ORAL | Status: DC

## 2013-02-24 MED ORDER — FLUTICASONE-SALMETEROL 250-50 MCG/DOSE IN AEPB: 1.0000 | INHALATION_SPRAY | Freq: Two times a day (BID) | RESPIRATORY_TRACT | Status: DC

## 2013-02-24 MED ORDER — ALLOPURINOL 100 MG PO TABS: 100.0000 mg | ORAL_TABLET | Freq: Every day | ORAL | Status: DC

## 2013-02-24 MED ORDER — CANDESARTAN CILEXETIL 32 MG PO TABS: 32.0000 mg | ORAL_TABLET | Freq: Every day | ORAL | Status: DC

## 2013-02-24 MED ORDER — SIMVASTATIN 40 MG PO TABS: 40.0000 mg | ORAL_TABLET | Freq: Every evening | ORAL | Status: DC

## 2013-02-24 MED ORDER — METOPROLOL SUCCINATE ER 50 MG PO TB24: 50.0000 mg | ORAL_TABLET | Freq: Every day | ORAL | Status: DC

## 2013-02-24 MED ORDER — ESOMEPRAZOLE MAGNESIUM 40 MG PO CPDR: 40.0000 mg | DELAYED_RELEASE_CAPSULE | Freq: Two times a day (BID) | ORAL | Status: DC

## 2013-02-24 MED ORDER — GLIPIZIDE 5 MG PO TABS: 5.0000 mg | ORAL_TABLET | Freq: Every day | ORAL | Status: DC

## 2013-02-24 NOTE — Assessment & Plan Note (Signed)
Chronic, continue simvastatin 40mg  daily. Await records from prior PCP for latest FLP.

## 2013-02-24 NOTE — Progress Notes (Signed)
Subjective:    Patient ID: Jesse Huynh, male    DOB: [birth year], 72 y.o.   MRN: [id number]  HPI CC: new medicare pt   Prior saw Dr. Dan Humphreys at [facility], last seen 3 mo ago.  DM - since 2009.  Last A1c unsure.  Fasting sugar 159.  Doesn't check frequently.  No low sugars.  No hypoglycemic sxs.  No paresthesias.  Last eye exam 2008.  Foot exam 01/2013.  HTN - on candesartan and toprol xl daily.  Thinks running better at home.  States excited because will go after today's appt looking for a new car.  No HA, chest pain.  HLD - tolerating simvastatin well.  Asthma since teen - on advair 250/50 once daily.  Doesn't use rescue inhalers.  Gout - allopurinol 100mg  daily.  Possible recent flares - 2-3 wks ago had gout flare, drained by podiatrist.  No frequent flares, non prior to this for 6-7 years.  H/o esophageal cancer dx 2012 - sees [facility] Dr. Doylene Canning and Dr. Rushie Chestnut (rad onc).  Presented with dysphagia.  S/p chemo and XRT.  In remission since 04/2012.  Planned PET scan done later this month.  On nexium 40mg  bid for GERD sxs as well.  Sees oncologist Q13mo - 1 year.  Dx with bronchitis in May - some residual cough still.  Preventative:  Unsure of last wellness exam. Colonoscopy - 2013 Pneumovax 2013 Tetanus - none recently - will receive today zostavax 2012  Lives with wife, 1 dog Occupation: retired, was Actuary, part time taxes [employer]: [employer] Activity: golf, walking Diet: good water, fruits/vegetables daily  Medications and allergies reviewed and updated in chart.  Past histories reviewed and updated if relevant as below. Patient Active Problem List   Diagnosis Date Noted  . Esophageal cancer 02/15/2012   Past Medical History  Diagnosis Date  . Hypertension   . Asthma   . T2DM (type 2 diabetes mellitus)   . GERD (gastroesophageal reflux disease)   . History of esophageal cancer 01-16-2012    s/p chemo and radiation, in remission  . Gout   . HLD  (hyperlipidemia)   . H/O seasonal allergies   . AAA (abdominal aortic aneurysm)     s/p repair   Past Surgical History  Procedure Laterality Date  . Abdominal aortic aneurysm repair  1998    open  . Hernia repair  2002    abdominal after AAA repair  . Knee sx  1965    left  . Eus  02/15/2012    Procedure: UPPER ENDOSCOPIC ULTRASOUND (EUS) RADIAL;  Surgeon: Rachael Fee, MD;  Location: [facility];  Service: Endoscopy;  Laterality: N/A;   History  Substance Use Topics  . Smoking status: Former Smoker -- 1.00 packs/day for 50 years    Types: Cigarettes    Quit date: 07/24/1996  . Smokeless tobacco: Never Used  . Alcohol Use: No     Comment: rare   Family History  Problem Relation Age of Onset  . Stroke Father     several  . Diabetes Father   . Hypertension Neg Hx   . Cancer Neg Hx   . CAD Neg Hx    Allergies  Allergen Reactions  . Sulfa Antibiotics Hives   Current Outpatient Prescriptions on File Prior to Visit  Medication Sig Dispense Refill  . allopurinol (ZYLOPRIM) 100 MG tablet Take 100 mg by mouth daily.      Marland Kitchen aspirin 81 MG tablet Take 81 mg  by mouth daily.      . candesartan (ATACAND) 32 MG tablet Take 32 mg by mouth daily.      Marland Kitchen esomeprazole (NEXIUM) 40 MG capsule Take 40 mg by mouth 2 (two) times daily.       . Fluticasone-Salmeterol (ADVAIR) 250-50 MCG/DOSE AEPB Inhale 1 puff into the lungs daily.       Marland Kitchen glipiZIDE (GLUCOTROL) 5 MG tablet Take 5 mg by mouth daily.       . metFORMIN (GLUCOPHAGE) 500 MG tablet Take 500 mg by mouth 2 (two) times daily with a meal.      . Multiple Vitamin (MULTIVITAMIN) tablet Take 1 tablet by mouth daily.      . simvastatin (ZOCOR) 40 MG tablet Take 40 mg by mouth every evening.       No current facility-administered medications on file prior to visit.     Review of Systems  Constitutional: Negative for fever, chills, activity change, appetite change, fatigue and unexpected weight change.  HENT: Negative for hearing  loss and neck pain.   Eyes: Negative for visual disturbance.  Respiratory: Negative for cough, chest tightness, shortness of breath and wheezing.   Cardiovascular: Negative for chest pain, palpitations and leg swelling.  Gastrointestinal: Negative for nausea, vomiting, abdominal pain, diarrhea, constipation, blood in stool and abdominal distention.  Genitourinary: Negative for hematuria and difficulty urinating.  Musculoskeletal: Negative for myalgias and arthralgias.  Skin: Negative for rash.  Neurological: Negative for dizziness, seizures, syncope and headaches.  Hematological: Negative for adenopathy. Does not bruise/bleed easily.  Psychiatric/Behavioral: Negative for dysphoric mood. The patient is not nervous/anxious.        Objective:   Physical Exam  Nursing note and vitals reviewed. Constitutional: He is oriented to person, place, and time. He appears well-developed and well-nourished. No distress.  HENT:  Head: Normocephalic and atraumatic.  Right Ear: Hearing, tympanic membrane, external ear and ear canal normal.  Left Ear: Hearing, tympanic membrane, external ear and ear canal normal.  Nose: Nose normal.  Mouth/Throat: Oropharynx is clear and moist. No oropharyngeal exudate.  Eyes: Conjunctivae and EOM are normal. Pupils are equal, round, and reactive to light. No scleral icterus.  Neck: Normal range of motion. Neck supple. Carotid bruit is not present. No thyromegaly present.  Cardiovascular: Normal rate, regular rhythm and intact distal pulses.   Murmur (1/6 SEM best at LUSB) heard. Pulses:      Radial pulses are 2+ on the right side, and 2+ on the left side.  Pulmonary/Chest: Effort normal and breath sounds normal. No respiratory distress. He has no wheezes. He has no rales.  Musculoskeletal: Normal range of motion. He exhibits no edema.  Lymphadenopathy:    He has no cervical adenopathy.  Neurological: He is alert and oriented to person, place, and time.  CN grossly  intact, station and gait intact  Skin: Skin is warm and dry. No rash noted.  Psychiatric: He has a normal mood and affect. His behavior is normal. Judgment and thought content normal.       Assessment & Plan:

## 2013-02-24 NOTE — Assessment & Plan Note (Signed)
Chronic, stable. Continue nexium bid.  H/o esoph cancer.

## 2013-02-24 NOTE — Assessment & Plan Note (Signed)
Chronic, elevated today. However, new office and doctor today. Recheck next visit.  If persistently elevated, change in therapy will be indicated.

## 2013-02-24 NOTE — Assessment & Plan Note (Signed)
Chronic, stable. Continue meds. Await records.

## 2013-02-24 NOTE — Assessment & Plan Note (Signed)
In remission. Sees onc Q6mo-31yr.

## 2013-02-24 NOTE — Assessment & Plan Note (Signed)
Continue allopurinol 100mg  daily - seems well controlled.

## 2013-02-24 NOTE — Assessment & Plan Note (Signed)
Continue advair qd for now.  No need for albuterol.  Discussed trial off advair in future.

## 2013-02-24 NOTE — Patient Instructions (Signed)
Schedule eye exam at your convenience. Good to see you today, call us with questions. Return at your convenience (2-3 months) for wellness exam, prior fasting for blood work.

## 2013-02-26 ENCOUNTER — Other Ambulatory Visit: Payer: Self-pay

## 2013-03-11 ENCOUNTER — Ambulatory Visit: Payer: Self-pay | Admitting: Oncology

## 2013-03-13 ENCOUNTER — Encounter: Payer: Self-pay | Admitting: *Deleted

## 2013-03-13 ENCOUNTER — Ambulatory Visit: Payer: Self-pay | Admitting: Oncology

## 2013-03-24 ENCOUNTER — Ambulatory Visit: Payer: Self-pay | Admitting: Oncology

## 2013-03-24 ENCOUNTER — Ambulatory Visit: Payer: Self-pay | Admitting: Family Medicine

## 2013-03-24 HISTORY — PX: ESOPHAGOGASTRODUODENOSCOPY: SHX1529

## 2013-03-27 ENCOUNTER — Encounter: Payer: Self-pay | Admitting: General Surgery

## 2013-03-27 ENCOUNTER — Ambulatory Visit (INDEPENDENT_AMBULATORY_CARE_PROVIDER_SITE_OTHER): Payer: Medicare Other | Admitting: General Surgery

## 2013-03-27 VITALS — BP 130/78 | HR 82 | Resp 14 | Ht 72.0 in | Wt 182.0 lb

## 2013-03-27 DIAGNOSIS — R1310 Dysphagia, unspecified: Secondary | ICD-10-CM | POA: Insufficient documentation

## 2013-03-27 DIAGNOSIS — Z8501 Personal history of malignant neoplasm of esophagus: Secondary | ICD-10-CM

## 2013-03-27 NOTE — Progress Notes (Signed)
Patient ID: Jesse Huynh, male   DOB: 1941-03-15, 72 y.o.   MRN: [id number]  Chief Complaint  Patient presents with  . Other    Difficulty Swallowing    HPI Jesse Huynh is a 72 y.o. male. Patient here today referred by Dr Doylene Canning for increased dysphagia. He recommends an endoscopy for follow up. Seems to be some of the same symptoms he had before.  With foods he says its "tight". Swallowing problems is more with solids than with liquids.It is not a very severe problem.  PET scan done August 2014. He has a known history of esophogeal cancer June 2013.  HPI  Past Medical History  Diagnosis Date  . Hypertension   . Asthma   . T2DM (type 2 diabetes mellitus)   . GERD (gastroesophageal reflux disease)   . History of esophageal cancer 01-16-2012    s/p chemo and radiation, in remission  . Gout   . HLD (hyperlipidemia)   . H/O seasonal allergies   . History of AAA (abdominal aortic aneurysm) repair     s/p open repair    Past Surgical History  Procedure Laterality Date  . Abdominal aortic aneurysm repair  1998    open  . Hernia repair  2002    abdominal after AAA repair  . Knee sx  1965    left  . Eus  02/15/2012    Procedure: UPPER ENDOSCOPIC ULTRASOUND (EUS) RADIAL;  Surgeon: Rachael Fee, MD;  Location: [facility];  Service: Endoscopy;  Laterality: N/A;  . Cataract extraction Bilateral 2008    Family History  Problem Relation Age of Onset  . Stroke Father     several  . Diabetes Father   . Hypertension Neg Hx   . Cancer Neg Hx   . CAD Neg Hx     Social History History  Substance Use Topics  . Smoking status: Former Smoker -- 1.00 packs/day for 50 years    Types: Cigarettes    Quit date: 07/24/1996  . Smokeless tobacco: Never Used  . Alcohol Use: No     Comment: rare    Allergies  Allergen Reactions  . Sulfa Antibiotics Hives    Current Outpatient Prescriptions  Medication Sig Dispense Refill  . allopurinol (ZYLOPRIM) 100 MG tablet Take 1 tablet  (100 mg total) by mouth daily.  90 tablet  3  . aspirin 81 MG tablet Take 81 mg by mouth daily.      . candesartan (ATACAND) 32 MG tablet Take 1 tablet (32 mg total) by mouth daily.  90 tablet  3  . esomeprazole (NEXIUM) 40 MG capsule Take 1 capsule (40 mg total) by mouth 2 (two) times daily.  180 capsule  3  . Fluticasone-Salmeterol (ADVAIR) 250-50 MCG/DOSE AEPB Inhale 1 puff into the lungs every 12 (twelve) hours.  60 each  6  . glipiZIDE (GLUCOTROL) 5 MG tablet Take 1 tablet (5 mg total) by mouth daily.  90 tablet  3  . metFORMIN (GLUCOPHAGE) 500 MG tablet Take 1 tablet (500 mg total) by mouth 2 (two) times daily with a meal.  180 tablet  3  . metoprolol succinate (TOPROL-XL) 50 MG 24 hr tablet Take 1 tablet (50 mg total) by mouth daily. Take with or immediately following a meal.  90 tablet  3  . Multiple Vitamin (MULTIVITAMIN) tablet Take 1 tablet by mouth daily.      . simvastatin (ZOCOR) 40 MG tablet Take 1 tablet (40 mg total) by mouth  every evening.  90 tablet  3   No current facility-administered medications for this visit.    Review of Systems Review of Systems  Constitutional: Negative.   HENT: Positive for trouble swallowing.   Respiratory: Negative.   Cardiovascular: Negative.     Blood pressure 130/78, pulse 82, resp. rate 14, height 6' (1.829 m), weight 182 lb (82.555 kg).  Physical Exam Physical Exam  Constitutional: He is oriented to person, place, and time. He appears well-developed and well-nourished.  Eyes: Conjunctivae are normal. No scleral icterus.  Neck: Neck supple. No tracheal deviation present. No mass and no thyromegaly present.  No abnormality noted with swallowing  Cardiovascular: Normal rate and regular rhythm.   Pulmonary/Chest: Effort normal and breath sounds normal.  Lymphadenopathy:    He has no cervical adenopathy.  Neurological: He is alert and oriented to person, place, and time.  Skin: Skin is warm and dry.    Data Reviewed Recent PET scan  shows no evidence of recurrent esophageal cancer.  Assessment    Patient is one year past radiation of the esophagus and his symptoms maybe result of mild narrowing of the site.    Plan    Upper endoscopy.    Patient has been scheduled for an upper endoscopy on 04-16-13 at [facility]. It is okay for patient to continue 81 mg aspirin.   SANKAR,SEEPLAPUTHUR G 03/27/2013, 7:27 PM

## 2013-03-27 NOTE — Patient Instructions (Addendum)
Upper endoscopy  Upper GI Endoscopy Upper GI endoscopy means using a flexible scope to look at the esophagus, stomach, and upper small bowel. This is done to make a diagnosis in people with heartburn, abdominal pain, or abnormal bleeding. Sometimes an endoscope is needed to remove foreign bodies or food that become stuck in the esophagus; it can also be used to take biopsy samples. For the best results, do not eat or drink for 8 hours before having your upper endoscopy.  To perform the endoscopy, you will probably be sedated and your throat will be numbed with a special spray. The endoscope is then slowly passed down your throat (this will not interfere with your breathing). An endoscopy exam takes 15 to 30 minutes to complete and there is no real pain. Patients rarely remember much about the procedure. The results of the test may take several days if a biopsy or other test is taken.  You may have a sore throat after an endoscopy exam. Serious complications are very rare. Stick to liquids and soft foods until your pain is better. Do not drive a car or operate any dangerous equipment for at least 24 hours after being sedated. SEEK IMMEDIATE MEDICAL CARE IF:   You have severe throat pain.   You have shortness of breath.   You have bleeding problems.   You have a fever.   You have difficulty recovering from your sedation.  Document Released: 08/17/2004 Document Revised: 06/29/2011 Document Reviewed: 07/12/2008 [facility] Patient Information 2012 [place], [place].  Patient has been scheduled for an upper endoscopy on 04-16-13 at [facility]. It is okay for patient to continue 81 mg aspirin.

## 2013-04-15 ENCOUNTER — Other Ambulatory Visit: Payer: Self-pay | Admitting: Family Medicine

## 2013-04-15 DIAGNOSIS — Z125 Encounter for screening for malignant neoplasm of prostate: Secondary | ICD-10-CM

## 2013-04-15 DIAGNOSIS — E785 Hyperlipidemia, unspecified: Secondary | ICD-10-CM

## 2013-04-15 DIAGNOSIS — I10 Essential (primary) hypertension: Secondary | ICD-10-CM

## 2013-04-15 DIAGNOSIS — E119 Type 2 diabetes mellitus without complications: Secondary | ICD-10-CM

## 2013-04-16 ENCOUNTER — Ambulatory Visit: Payer: Self-pay | Admitting: General Surgery

## 2013-04-16 DIAGNOSIS — Z8501 Personal history of malignant neoplasm of esophagus: Secondary | ICD-10-CM

## 2013-04-16 DIAGNOSIS — R1310 Dysphagia, unspecified: Secondary | ICD-10-CM

## 2013-04-17 ENCOUNTER — Encounter: Payer: Self-pay | Admitting: General Surgery

## 2013-04-20 ENCOUNTER — Encounter: Payer: Self-pay | Admitting: Family Medicine

## 2013-04-20 DIAGNOSIS — N183 Chronic kidney disease, stage 3 (moderate): Secondary | ICD-10-CM

## 2013-04-20 DIAGNOSIS — N320 Bladder-neck obstruction: Secondary | ICD-10-CM | POA: Insufficient documentation

## 2013-04-20 DIAGNOSIS — D649 Anemia, unspecified: Secondary | ICD-10-CM | POA: Insufficient documentation

## 2013-04-21 ENCOUNTER — Encounter: Payer: Self-pay | Admitting: General Surgery

## 2013-04-21 ENCOUNTER — Other Ambulatory Visit (INDEPENDENT_AMBULATORY_CARE_PROVIDER_SITE_OTHER): Payer: Medicare Other

## 2013-04-21 DIAGNOSIS — E119 Type 2 diabetes mellitus without complications: Secondary | ICD-10-CM

## 2013-04-21 DIAGNOSIS — E785 Hyperlipidemia, unspecified: Secondary | ICD-10-CM

## 2013-04-21 DIAGNOSIS — D649 Anemia, unspecified: Secondary | ICD-10-CM

## 2013-04-21 DIAGNOSIS — I10 Essential (primary) hypertension: Secondary | ICD-10-CM

## 2013-04-21 DIAGNOSIS — Z125 Encounter for screening for malignant neoplasm of prostate: Secondary | ICD-10-CM

## 2013-04-21 DIAGNOSIS — I129 Hypertensive chronic kidney disease with stage 1 through stage 4 chronic kidney disease, or unspecified chronic kidney disease: Secondary | ICD-10-CM

## 2013-04-21 LAB — COMPREHENSIVE METABOLIC PANEL
AST: 29 U/L (ref 0–37)
BUN: 20 mg/dL (ref 6–23)
CO2: 27 mEq/L (ref 19–32)
Calcium: 9.5 mg/dL (ref 8.4–10.5)
Chloride: 106 mEq/L (ref 96–112)
Creatinine, Ser: 1.5 mg/dL (ref 0.4–1.5)
GFR: 47.63 mL/min — ABNORMAL LOW (ref >60.00)
Glucose, Bld: 115 mg/dL — ABNORMAL HIGH (ref 70–99)
Total Bilirubin: 0.6 mg/dL (ref 0.3–1.2)

## 2013-04-21 LAB — PSA, MEDICARE: PSA: 0.62 ng/ml (ref 0.10–4.00)

## 2013-04-21 LAB — LIPID PANEL
LDL Cholesterol: 62 mg/dL (ref 0–99)
VLDL: 22.6 mg/dL (ref 0.0–40.0)

## 2013-04-22 ENCOUNTER — Encounter: Payer: Self-pay | Admitting: Family Medicine

## 2013-04-22 ENCOUNTER — Telehealth: Payer: Self-pay | Admitting: General Surgery

## 2013-04-22 NOTE — Telephone Encounter (Signed)
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 Pt advised. Dr Doylene Canning also informed-he will have pt see him tomorrow. Tentative plan for endoscopic [place].

## 2013-04-23 ENCOUNTER — Ambulatory Visit: Payer: Self-pay | Admitting: Oncology

## 2013-04-23 LAB — COMPREHENSIVE METABOLIC PANEL
Albumin: 3.6 g/dL (ref 3.4–5.0)
Anion Gap: 11 (ref 7–16)
Bilirubin,Total: 0.3 mg/dL (ref 0.2–1.0)
Calcium, Total: 8.9 mg/dL (ref 8.5–10.1)
Co2: 25 mmol/L (ref 21–32)
Creatinine: 1.64 mg/dL — ABNORMAL HIGH (ref 0.60–1.30)
Glucose: 246 mg/dL — ABNORMAL HIGH (ref 65–99)
Osmolality: 288 (ref 275–301)
SGPT (ALT): 29 U/L (ref 12–78)
Sodium: 139 mmol/L (ref 136–145)

## 2013-04-23 LAB — CBC CANCER CENTER
Basophil #: 0 x10 3/mm (ref 0.0–0.1)
Basophil %: 0.3 %
Eosinophil #: 0.2 x10 3/mm (ref 0.0–0.7)
Eosinophil %: 2.8 %
HCT: 37.9 % — ABNORMAL LOW (ref 40.0–52.0)
HGB: 12.5 g/dL — ABNORMAL LOW (ref 13.0–18.0)
MCH: 27.2 pg (ref 26.0–34.0)
MCHC: 33 g/dL (ref 32.0–36.0)
Monocyte %: 8.1 %
Neutrophil #: 6.8 x10 3/mm — ABNORMAL HIGH (ref 1.4–6.5)
Platelet: 130 x10 3/mm — ABNORMAL LOW (ref 150–440)
RBC: 4.61 10*6/uL (ref 4.40–5.90)
RDW: 15.2 % — ABNORMAL HIGH (ref 11.5–14.5)
WBC: 8.3 x10 3/mm (ref 3.8–10.6)

## 2013-04-23 LAB — APTT: Activated PTT: 27.1 secs (ref 23.6–35.9)

## 2013-04-24 ENCOUNTER — Ambulatory Visit: Payer: Self-pay | Admitting: Gastroenterology

## 2013-04-28 ENCOUNTER — Ambulatory Visit (INDEPENDENT_AMBULATORY_CARE_PROVIDER_SITE_OTHER): Payer: Medicare Other | Admitting: Family Medicine

## 2013-04-28 ENCOUNTER — Encounter: Payer: Self-pay | Admitting: Family Medicine

## 2013-04-28 VITALS — BP 138/76 | HR 80 | Temp 98.1°F | Ht 72.0 in | Wt 183.5 lb

## 2013-04-28 DIAGNOSIS — Z8501 Personal history of malignant neoplasm of esophagus: Secondary | ICD-10-CM

## 2013-04-28 DIAGNOSIS — E1165 Type 2 diabetes mellitus with hyperglycemia: Secondary | ICD-10-CM

## 2013-04-28 DIAGNOSIS — E785 Hyperlipidemia, unspecified: Secondary | ICD-10-CM

## 2013-04-28 DIAGNOSIS — I10 Essential (primary) hypertension: Secondary | ICD-10-CM

## 2013-04-28 DIAGNOSIS — Z23 Encounter for immunization: Secondary | ICD-10-CM

## 2013-04-28 DIAGNOSIS — Z0000 Encounter for general adult medical examination without abnormal findings: Secondary | ICD-10-CM

## 2013-04-28 LAB — PATHOLOGY REPORT

## 2013-04-28 NOTE — Progress Notes (Signed)
Subjective:    Patient ID: Jesse Huynh, male    DOB: Mar 27, 1941, 72 y.o.   MRN: [id number]  HPI CC: medicare wellness  Esophageal cancer has returned.  Following with Dr. Evette Cristal and [facility].  F/u planned this afternoon with Choski.  H/o AAA repair.  Calf cramping when walking any distance longer than 1/2 mile.  states had normal ABIs in past.  Passes hearing screen.  Some trouble R eye vision, o/w normal.  H/o R eye lens implant 2009.  No recent eye exam. Denies depression, falls.  No anhedonia.  Preventative:  Colonoscopy - 2013.  Told rpt colonoscopy in 4-5 years Evette Cristal).  They are contacted when f/u is due. Prostate cancer screening - has had pet scans without abnormal prostate report.  Declines screening at this time. Pneumovax 2012 Tetanus - 02/2013 zostavax 2012  Flu shot today. Advanced directives: living will in chart.  Wife is HCPOA.  Lives with wife, 1 dog  Occupation: retired, was Actuary, part time taxes  Edu: [employer]  Activity: golf, walking  Diet: good water, fruits/vegetables daily  Medications and allergies reviewed and updated in chart.  Past histories reviewed and updated if relevant as below. Patient Active Problem List   Diagnosis Date Noted  . Chronic anemia   . Bladder outlet obstruction   . CKD (chronic kidney disease) stage 3, GFR 30-59 ml/min   . Dysphagia, unspecified(787.20) 03/27/2013  . Personal history of esophageal cancer 03/27/2013  . Hypertension   . Asthma   . T2DM (type 2 diabetes mellitus)   . GERD (gastroesophageal reflux disease)   . Gout   . HLD (hyperlipidemia)   . History of AAA (abdominal aortic aneurysm) repair   . History of esophageal cancer 01/16/2012   Past Medical History  Diagnosis Date  . Hypertension   . Asthma   . T2DM (type 2 diabetes mellitus)   . GERD (gastroesophageal reflux disease)   . History of esophageal cancer 01-16-2012    s/p chemo and radiation, in remission  . Gout   . HLD  (hyperlipidemia)   . H/O seasonal allergies   . History of AAA (abdominal aortic aneurysm) repair     s/p open repair  . CKD (chronic kidney disease) stage 3, GFR 30-59 ml/min   . Chronic anemia   . PVD (peripheral vascular disease)   . Bladder outlet obstruction   . Cardiomyopathy     per prior PCP   Past Surgical History  Procedure Laterality Date  . Abdominal aortic aneurysm repair  1998    open  . Hernia repair  2002    abdominal after AAA repair  . Knee sx  1965    left  . Eus  02/15/2012    Procedure: UPPER ENDOSCOPIC ULTRASOUND (EUS) RADIAL;  Surgeon: Rachael Fee, MD;  Location: [facility];  Service: Endoscopy;  Laterality: N/A;  . Cataract extraction Bilateral 2008  . 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 Smoking status: Former Smoker -- 1.00 packs/day for 50 years    Types: Cigarettes    Quit date: 07/24/1996  . Smokeless tobacco: Never Used  . Alcohol Use: No     Comment: rare   Family History  Problem Relation Age of Onset  . Stroke Father     several  . Diabetes Father   . Hypertension Neg Hx   . Cancer Neg  Hx   . CAD Neg Hx    Allergies  Allergen Reactions  . Sulfa Antibiotics Hives  . Ace Inhibitors    Current Outpatient Prescriptions on File Prior to Visit  Medication Sig Dispense Refill  . allopurinol (ZYLOPRIM) 100 MG tablet Take 1 tablet (100 mg total) by mouth daily.  90 tablet  3  . aspirin 81 MG tablet Take 81 mg by mouth daily.      . candesartan (ATACAND) 32 MG tablet Take 1 tablet (32 mg total) by mouth daily.  90 tablet  3  . esomeprazole (NEXIUM) 40 MG capsule Take 1 capsule (40 mg total) by mouth 2 (two) times daily.  180 capsule  3  . Fluticasone-Salmeterol (ADVAIR) 250-50 MCG/DOSE AEPB Inhale 1 puff into the lungs every 12 (twelve) hours.  60 each  6  . glipiZIDE (GLUCOTROL) 5 MG tablet Take 1 tablet (5 mg total) by mouth  daily.  90 tablet  3  . metFORMIN (GLUCOPHAGE) 500 MG tablet Take 1 tablet (500 mg total) by mouth 2 (two) times daily with a meal.  180 tablet  3  . metoprolol succinate (TOPROL-XL) 50 MG 24 hr tablet Take 1 tablet (50 mg total) by mouth daily. Take with or immediately following a meal.  90 tablet  3  . Multiple Vitamin (MULTIVITAMIN) tablet Take 1 tablet by mouth daily.      . simvastatin (ZOCOR) 40 MG tablet Take 1 tablet (40 mg total) by mouth every evening.  90 tablet  3   No current facility-administered medications on file prior to visit.     Review of Systems  Constitutional: Negative for fever, chills, activity change, appetite change, fatigue and unexpected weight change.  HENT: Negative for hearing loss and neck pain.   Eyes: Negative for visual disturbance.  Respiratory: Negative for cough, chest tightness, shortness of breath and wheezing.   Cardiovascular: Negative for chest pain, palpitations and leg swelling.  Gastrointestinal: Negative for nausea, vomiting, abdominal pain, diarrhea, constipation, blood in stool and abdominal distention.  Genitourinary: Negative for hematuria and difficulty urinating.  Musculoskeletal: Negative for myalgias and arthralgias.  Skin: Negative for rash.  Neurological: Negative for dizziness, seizures, syncope and headaches.  Hematological: Negative for adenopathy. Does not bruise/bleed easily.  Psychiatric/Behavioral: Negative for dysphoric mood. The patient is not nervous/anxious.        Objective:   Physical Exam  Nursing note and vitals reviewed. Constitutional: He is oriented to person, place, and time. He appears well-developed and well-nourished. No distress.  HENT:  Head: Normocephalic and atraumatic.  Right Ear: External ear normal.  Left Ear: External ear normal.  Nose: Nose normal.  Mouth/Throat: Oropharynx is clear and moist. No oropharyngeal exudate.  Eyes: Conjunctivae and EOM are normal. Pupils are equal, round, and  reactive to light. No scleral icterus.  Neck: Normal range of motion. Neck supple. No thyromegaly present.  Cardiovascular: Normal rate, regular rhythm, normal heart sounds and intact distal pulses.   No murmur heard. Pulses:      Radial pulses are 2+ on the right side, and 2+ on the left side.  Pulmonary/Chest: Effort normal and breath sounds normal. No respiratory distress. He has no wheezes. He has no rales.  Abdominal: Soft. Bowel sounds are normal. He exhibits no distension and no mass. There is no tenderness. There is no rebound and no guarding.  Genitourinary:  deferred  Musculoskeletal: Normal range of motion. He exhibits no edema.  1+ DP/PT  Lymphadenopathy:    He  has no cervical adenopathy.  Neurological: He is alert and oriented to person, place, and time.  CN grossly intact, station and gait intact  Skin: Skin is warm and dry. No rash noted.  Psychiatric: He has a normal mood and affect. His behavior is normal. Judgment and thought content normal.      Assessment & Plan:

## 2013-04-28 NOTE — Assessment & Plan Note (Signed)
F/u planned with Chofski this afternoon to discuss treatment options.

## 2013-04-28 NOTE — Assessment & Plan Note (Signed)
Chronic, stable. Continue simvastatin. Recommended increased aerobic exercise - try stationary bike

## 2013-04-28 NOTE — Assessment & Plan Note (Signed)
I have personally reviewed the Medicare Annual Wellness questionnaire and have noted 1. The patient's medical and social history 2. Their use of alcohol, tobacco or illicit drugs 3. Their current medications and supplements 4. The patient's functional ability including ADL's, fall risks, home safety risks and hearing or visual impairment. 5. Diet and physical activity 6. Evidence for depression or mood disorders The patients weight, height, BMI have been recorded in the chart.  Hearing and vision has been addressed. I have made referrals, counseling and provided education to the patient based review of the above and I have provided the pt with a written personalized care plan for preventive services. See scanned questionairre. Advanced directives discussed: see HPI.  Reviewed preventative protocols and updated unless pt declined.

## 2013-04-28 NOTE — Assessment & Plan Note (Signed)
Reviewed with patient. Recheck in 6 mo.

## 2013-04-28 NOTE — Addendum Note (Signed)
Addended by: Josph Macho A on: 04/28/2013 12:35 PM   Modules accepted: Orders

## 2013-04-28 NOTE — Assessment & Plan Note (Signed)
Chronic, stable. Continue meds. 

## 2013-04-28 NOTE — Patient Instructions (Signed)
Flu shot today. Schedule eye doctor appointment - I do recommend yearly with diabetes. Good to see you today, call us with questions. Return in 6 months for follow up of diabetes and kidneys, prior for blood work.

## 2013-04-28 NOTE — Assessment & Plan Note (Signed)
A1c slightly above goal - encouraged stricter compliance with diabetic diet. No med changes today. Recommended schedule eye exam as over due.

## 2013-04-30 ENCOUNTER — Encounter: Payer: Self-pay | Admitting: *Deleted

## 2013-05-13 ENCOUNTER — Ambulatory Visit: Payer: Medicare Other | Admitting: General Surgery

## 2013-05-24 ENCOUNTER — Ambulatory Visit: Payer: Self-pay | Admitting: Oncology

## 2013-05-29 ENCOUNTER — Other Ambulatory Visit: Payer: Self-pay

## 2013-06-16 LAB — CBC CANCER CENTER
Basophil #: 0 x10 3/mm (ref 0.0–0.1)
Basophil %: 0.4 %
Eosinophil #: 0.4 x10 3/mm (ref 0.0–0.7)
Eosinophil %: 4.8 %
HCT: 35.9 % — ABNORMAL LOW (ref 40.0–52.0)
HGB: 11.7 g/dL — ABNORMAL LOW (ref 13.0–18.0)
Lymphocyte #: 0.5 x10 3/mm — ABNORMAL LOW (ref 1.0–3.6)
Lymphocyte %: 6.5 %
MCH: 26.4 pg (ref 26.0–34.0)
MCHC: 32.5 g/dL (ref 32.0–36.0)
Monocyte %: 8.7 %
Neutrophil #: 6.5 x10 3/mm (ref 1.4–6.5)
Neutrophil %: 79.6 %
RBC: 4.42 10*6/uL (ref 4.40–5.90)
WBC: 8.1 x10 3/mm (ref 3.8–10.6)

## 2013-06-16 LAB — COMPREHENSIVE METABOLIC PANEL
Albumin: 3.3 g/dL — ABNORMAL LOW (ref 3.4–5.0)
Alkaline Phosphatase: 89 U/L
Calcium, Total: 9.4 mg/dL (ref 8.5–10.1)
Co2: 25 mmol/L (ref 21–32)
Creatinine: 1.61 mg/dL — ABNORMAL HIGH (ref 0.60–1.30)
EGFR (Non-African Amer.): 42 — ABNORMAL LOW
Osmolality: 288 (ref 275–301)
Potassium: 4.8 mmol/L (ref 3.5–5.1)
Sodium: 141 mmol/L (ref 136–145)
Total Protein: 7.3 g/dL (ref 6.4–8.2)

## 2013-06-23 ENCOUNTER — Ambulatory Visit: Payer: Self-pay | Admitting: Oncology

## 2013-07-18 ENCOUNTER — Encounter: Payer: Self-pay | Admitting: Family Medicine

## 2013-07-24 ENCOUNTER — Ambulatory Visit: Payer: Self-pay | Admitting: Oncology

## 2013-07-28 ENCOUNTER — Other Ambulatory Visit: Payer: Self-pay | Admitting: *Deleted

## 2013-07-28 ENCOUNTER — Other Ambulatory Visit: Payer: Self-pay

## 2013-07-28 MED ORDER — FLUTICASONE-SALMETEROL 250-50 MCG/DOSE IN AEPB: 1.0000 | INHALATION_SPRAY | Freq: Two times a day (BID) | RESPIRATORY_TRACT | Status: DC

## 2013-07-28 MED ORDER — CANDESARTAN CILEXETIL 32 MG PO TABS: 32.0000 mg | ORAL_TABLET | Freq: Every day | ORAL | Status: AC

## 2013-07-28 MED ORDER — GLIPIZIDE 5 MG PO TABS: 5.0000 mg | ORAL_TABLET | Freq: Every day | ORAL | Status: DC

## 2013-07-28 MED ORDER — SIMVASTATIN 40 MG PO TABS
40.0000 mg | ORAL_TABLET | Freq: Every evening | ORAL | Status: AC
Start: 2013-07-28 — End: ?

## 2013-07-28 MED ORDER — METOPROLOL SUCCINATE ER 50 MG PO TB24: 50.0000 mg | ORAL_TABLET | Freq: Every day | ORAL | Status: AC

## 2013-07-28 MED ORDER — ESOMEPRAZOLE MAGNESIUM 40 MG PO CPDR: 40.0000 mg | DELAYED_RELEASE_CAPSULE | Freq: Two times a day (BID) | ORAL | Status: AC

## 2013-07-28 MED ORDER — ALLOPURINOL 100 MG PO TABS: 100.0000 mg | ORAL_TABLET | Freq: Every day | ORAL | Status: AC

## 2013-07-28 MED ORDER — METFORMIN HCL 500 MG PO TABS: 500.0000 mg | ORAL_TABLET | Freq: Two times a day (BID) | ORAL | Status: AC

## 2013-07-28 NOTE — Telephone Encounter (Signed)
Pt request refill advair to [facility]. Advised done.

## 2013-07-29 ENCOUNTER — Encounter: Payer: Self-pay | Admitting: Family Medicine

## 2013-08-01 ENCOUNTER — Encounter: Payer: Self-pay | Admitting: Internal Medicine

## 2013-08-01 ENCOUNTER — Ambulatory Visit (INDEPENDENT_AMBULATORY_CARE_PROVIDER_SITE_OTHER): Payer: Medicare Other | Admitting: Internal Medicine

## 2013-08-01 VITALS — BP 132/76 | HR 83 | Temp 98.2°F | Wt 169.8 lb

## 2013-08-01 DIAGNOSIS — K5900 Constipation, unspecified: Secondary | ICD-10-CM

## 2013-08-01 DIAGNOSIS — R141 Gas pain: Secondary | ICD-10-CM

## 2013-08-01 DIAGNOSIS — R140 Abdominal distension (gaseous): Secondary | ICD-10-CM

## 2013-08-01 DIAGNOSIS — K219 Gastro-esophageal reflux disease without esophagitis: Secondary | ICD-10-CM

## 2013-08-01 DIAGNOSIS — R300 Dysuria: Secondary | ICD-10-CM

## 2013-08-01 DIAGNOSIS — R143 Flatulence: Secondary | ICD-10-CM

## 2013-08-01 DIAGNOSIS — R142 Eructation: Secondary | ICD-10-CM

## 2013-08-01 LAB — POCT URINALYSIS DIPSTICK
BILIRUBIN UA: NEGATIVE
Blood, UA: NEGATIVE
Glucose, UA: NEGATIVE
KETONES UA: NEGATIVE
Leukocytes, UA: NEGATIVE
Nitrite, UA: NEGATIVE
Spec Grav, UA: 1.015
Urobilinogen, UA: NEGATIVE
pH, UA: 6

## 2013-08-01 NOTE — Progress Notes (Signed)
Pre-visit discussion using our clinic review tool. No additional management support is needed unless otherwise documented below in the visit note.  

## 2013-08-01 NOTE — Progress Notes (Signed)
Subjective:    Patient ID: Jesse Huynh, male    DOB: 10-26-1940, 73 y.o.   MRN: [id number]  HPI  Pt presents to the clinic today with multiple complaints.  1- He has had some abdominal cramping, belching, bloating and constipation. This started about 2 weeks ago. He wont have a BM for 2 days, then he will have 4-5 small BM in 1 morning. He is having small bowel movements in the last 4 days. He feels like he is not completely emptying his colon. He has had some worsening reflux over the last few days. He does take Nexium daily. He has not had any changes in his diet. He has not taken anything OTC. He denies blood in his stool.  He has never had a issue with constipation in the past. He does have esophageal cancer and does plan to have surgery for this 08/2013. 2- He c/o burning with urination. He c/o cramping before and the burning sensation throughtout the entire stream. He has not seen any blood in his urine. He does not feel like he is forcing his urine out. He denies fever, chills of body aches.   Review of Systems      Past Medical History  Diagnosis Date  . Hypertension   . Asthma   . T2DM (type 2 diabetes mellitus)   . GERD (gastroesophageal reflux disease)   . Esophageal cancer 12/2011, 06/2013    s/p chemo and radiation, recurrence of SCC - discussing esophagectomy vs PDT 2014  . Gout   . HLD (hyperlipidemia)   . H/O seasonal allergies   . History of AAA (abdominal aortic aneurysm) repair     s/p open repair  . CKD (chronic kidney disease) stage 3, GFR 30-59 ml/min     one poorly functioning kidney Candiss Norse)  . Chronic anemia   . PVD (peripheral vascular disease)   . Bladder outlet obstruction   . Cardiomyopathy     per prior PCP    Current Outpatient Prescriptions  Medication Sig Dispense Refill  . allopurinol (ZYLOPRIM) 100 MG tablet Take 1 tablet (100 mg total) by mouth daily.  90 tablet  3  . aspirin 81 MG tablet Take 81 mg by mouth daily.      . candesartan  (ATACAND) 32 MG tablet Take 1 tablet (32 mg total) by mouth daily.  90 tablet  3  . esomeprazole (NEXIUM) 40 MG capsule Take 1 capsule (40 mg total) by mouth 2 (two) times daily.  180 capsule  3  . Fluticasone-Salmeterol (ADVAIR) 250-50 MCG/DOSE AEPB Inhale 1 puff into the lungs every 12 (twelve) hours.  60 each  2  . glipiZIDE (GLUCOTROL) 5 MG tablet Take 1 tablet (5 mg total) by mouth daily.  90 tablet  3  . metFORMIN (GLUCOPHAGE) 500 MG tablet Take 1 tablet (500 mg total) by mouth 2 (two) times daily with a meal.  180 tablet  3  . metoprolol succinate (TOPROL-XL) 50 MG 24 hr tablet Take 1 tablet (50 mg total) by mouth daily. Take with or immediately following a meal.  90 tablet  3  . Multiple Vitamin (MULTIVITAMIN) tablet Take 1 tablet by mouth daily.      . simvastatin (ZOCOR) 40 MG tablet Take 1 tablet (40 mg total) by mouth every evening.  90 tablet  3   No current facility-administered medications for this visit.    Allergies  Allergen Reactions  . Sulfa Antibiotics Hives  . Ace Inhibitors  Family History  Problem Relation Age of Onset  . Stroke Father     several  . Diabetes Father   . Hypertension Neg Hx   . Cancer Neg Hx   . CAD Neg Hx     History   Social History  . Marital Status: Married    Spouse Name: N/A    Number of Children: N/A  . Years of Education: N/A   Occupational History  . Not on file.   Social History Main Topics  . Smoking status: Former Smoker -- 1.00 packs/day for 50 years    Types: Cigarettes    Quit date: 07/24/1996  . Smokeless tobacco: Never Used  . Alcohol Use: No     Comment: rare  . Drug Use: No  . Sexual Activity: Not on file   Other Topics Concern  . Not on file   Social History Narrative   Lives with wife, 1 dog   Occupation: retired, was Estate manager/land agent, part time taxes   Edu: BSEE   Activity: golf, walking   Diet: good water, fruits/vegetables daily      Advanced directives: living will in place, wife is  HCPOA     Constitutional: Denies fever, malaise, fatigue, headache or abrupt weight changes.  Gastrointestinal: Denies  diarrhea or blood in the stool.  GU: Denies urgency, frequency, pain with urination,  blood in urine, odor or discharge.   No other specific complaints in a complete review of systems (except as listed in HPI above).  Objective:   Physical Exam   BP 132/76  Pulse 83  Temp(Src) 98.2 F (36.8 C) (Oral)  Wt 169 lb 12 oz (76.998 kg)  SpO2 98% Wt Readings from Last 3 Encounters:  08/01/13 169 lb 12 oz (76.998 kg)  04/28/13 183 lb 8 oz (83.235 kg)  03/27/13 182 lb (82.555 kg)    General: Appears his stated age, well developed, well nourished in NAD. Cardiovascular: Normal rate and rhythm. S1,S2 noted.  No murmur, rubs or gallops noted. No JVD or BLE edema. No carotid bruits noted. Pulmonary/Chest: Normal effort and positive vesicular breath sounds. No respiratory distress. No wheezes, rales or ronchi noted.  Abdomen: Soft and nontender. Normal bowel sounds, no bruits noted. Mild distention but no masses noted. Liver, spleen and kidneys non palpable.   BMET    Component Value Date/Time   NA 139 04/21/2013 0836   K 4.5 04/21/2013 0836   K 4.2 11/12/2012   CL 106 04/21/2013 0836   CO2 27 04/21/2013 0836   GLUCOSE 115* 04/21/2013 0836   BUN 20 04/21/2013 0836   CREATININE 1.5 04/21/2013 0836   CREATININE 1.4 11/12/2012   CALCIUM 9.5 04/21/2013 0836   CALCIUM 9.6 11/12/2012    Lipid Panel     Component Value Date/Time   CHOL 113 04/21/2013 0836   TRIG 113.0 04/21/2013 0836   TRIG 179 11/12/2012   HDL 28.30* 04/21/2013 0836   CHOLHDL 4 04/21/2013 0836   VLDL 22.6 04/21/2013 0836   LDLCALC 62 04/21/2013 0836   LDLCALC 47.4 11/12/2012    CBC    Component Value Date/Time   WBC 7.4 11/12/2012   HGB 12.1 11/12/2012    Hgb A1C Lab Results  Component Value Date   HGBA1C 7.1* 04/21/2013        Assessment & Plan:   Constipation, bloating and belching:  Advised  pt to try an OTC probiotic such as align I would decrease the amount of carbonated beverages you are drinking  and increase your water intake  Reflux:  Pt already on Nexium Advised him he could take Tums in addition to Nexium if needed  Burning sensation with urination:  Urinalysis negative Advised pt to increase his fluid intake  RTC in 1 week if symptoms persist or do not improve

## 2013-08-01 NOTE — Patient Instructions (Signed)

## 2013-08-08 ENCOUNTER — Ambulatory Visit (INDEPENDENT_AMBULATORY_CARE_PROVIDER_SITE_OTHER): Payer: Medicare Other | Admitting: Family Medicine

## 2013-08-08 ENCOUNTER — Encounter: Payer: Self-pay | Admitting: Family Medicine

## 2013-08-08 VITALS — BP 136/76 | HR 72 | Temp 97.6°F | Wt 168.5 lb

## 2013-08-08 DIAGNOSIS — R143 Flatulence: Secondary | ICD-10-CM

## 2013-08-08 DIAGNOSIS — R140 Abdominal distension (gaseous): Secondary | ICD-10-CM | POA: Insufficient documentation

## 2013-08-08 DIAGNOSIS — R141 Gas pain: Secondary | ICD-10-CM

## 2013-08-08 DIAGNOSIS — R142 Eructation: Secondary | ICD-10-CM

## 2013-08-08 MED ORDER — POLYETHYLENE GLYCOL 3350 17 GM/SCOOP PO POWD: 17.0000 g | Freq: Every day | ORAL | Status: AC | PRN

## 2013-08-08 NOTE — Patient Instructions (Signed)
I don't think there's a prostate infection. I think part of the bloating may be due to the esophagus issue. Let's increase water during the day - try for 6-8 8oz glasses of water daily. Let's start miralax 1/2 to 1 capful in [place] water daily. Start gas X over the counter for bloating and gassiness. Give me an update with how this works.

## 2013-08-08 NOTE — Assessment & Plan Note (Signed)
With bleching/flatulence and irregular bowel movements. Anticipate some of this is esophageal cancer related, but may also be lower GI stool bulking issue. I recommended he start Gas X for gas as well as increase water intake and start miralax - if this is ineffective, would consider soluble fiber. prostate exam unrevealing today.  I advised him if LUTS returning, to come in for urine specimen only. Pt agrees with plan.

## 2013-08-08 NOTE — Progress Notes (Signed)
   Subjective:    Patient ID: Jesse Huynh, male    DOB: 06/23/1941, 73 y.o.   MRN: [id number]  HPI CC: f/u constipation  Seen here 1 wk ago with dx constipation, rec start align probiotic OTC.  Has not tried any other medication for this.  Constipation described as feeling of need to stool but then nothing comes out. Does have soft stools when he goes but very small amounts.  Gas (flatulence and belching) and bloating of abdomen has improved some with align but persistent issue.  Appetite loss and weight loss attributed to bloating feeling.  Continues taking nexium which is controlling GERD sxs well. Does drink 2 cups water per day.  No dysphagia.  + early satiety but still eating 3 square meals a day. Has formed stool once daily but urge comes 4-5 times a day. Continued urinary discomfort intermittently noted with prolonged sitting.  No discomfort last few night.  This was associated with dysuria, urgency and frequency but that is now better.  No hematuria, back or flank pain, abd pain, nausea/vomiting. Wt Readings from Last 3 Encounters:  08/08/13 168 lb 8 oz (76.431 kg)  08/01/13 169 lb 12 oz (76.998 kg)  04/28/13 183 lb 8 oz (83.235 kg)   Known esophageal cancer recurrence, pending surgery next month at [facility] (esophagectomy).  Past Medical History  Diagnosis Date  . Hypertension   . Asthma   . T2DM (type 2 diabetes mellitus)   . GERD (gastroesophageal reflux disease)   . Esophageal cancer 12/2011, 06/2013    s/p chemo and radiation, recurrence of SCC - discussing esophagectomy vs PDT 2014  . Gout   . HLD (hyperlipidemia)   . H/O seasonal allergies   . History of AAA (abdominal aortic aneurysm) repair     s/p open repair  . CKD (chronic kidney disease) stage 3, GFR 30-59 ml/min     one poorly functioning kidney Candiss Norse)  . Chronic anemia   . PVD (peripheral vascular disease)   . Bladder outlet obstruction   . Cardiomyopathy     per prior PCP     Review of Systems Per HPI    Objective:   Physical Exam  Nursing note and vitals reviewed. Constitutional: He appears well-developed and well-nourished. No distress.  HENT:  Mouth/Throat: Oropharynx is clear and moist. No oropharyngeal exudate.  Cardiovascular: Normal rate, regular rhythm, normal heart sounds and intact distal pulses.   No murmur heard. Pulmonary/Chest: Effort normal and breath sounds normal. No respiratory distress. He has no wheezes. He has no rales.  Abdominal: Soft. Normal appearance and bowel sounds are normal. He exhibits no distension and no mass. There is no tenderness. There is no rigidity, no rebound, no guarding, no CVA tenderness and negative Murphy's sign.    Large midline scar  Genitourinary: Rectum normal and prostate normal. Rectal exam shows no external hemorrhoid, no internal hemorrhoid, no fissure, no mass, no tenderness and anal tone normal. Prostate is not enlarged (20gm) and not tender.  Musculoskeletal: He exhibits no edema.  Psychiatric: He has a normal mood and affect.       Assessment & Plan:

## 2013-08-08 NOTE — Progress Notes (Signed)
Pre-visit discussion using our clinic review tool. No additional management support is needed unless otherwise documented below in the visit note.  

## 2013-08-19 ENCOUNTER — Ambulatory Visit (INDEPENDENT_AMBULATORY_CARE_PROVIDER_SITE_OTHER): Payer: Medicare Other | Admitting: Family Medicine

## 2013-08-19 ENCOUNTER — Ambulatory Visit (INDEPENDENT_AMBULATORY_CARE_PROVIDER_SITE_OTHER)
Admission: RE | Admit: 2013-08-19 | Discharge: 2013-08-19 | Disposition: A | Discharge status: Home / Self Care | Payer: Medicare Other | Source: Ambulatory Visit | Attending: Family Medicine | Admitting: Family Medicine

## 2013-08-19 ENCOUNTER — Encounter: Payer: Self-pay | Admitting: Family Medicine

## 2013-08-19 VITALS — BP 126/64 | HR 60 | Temp 98.1°F | Wt 164.5 lb

## 2013-08-19 DIAGNOSIS — R142 Eructation: Secondary | ICD-10-CM

## 2013-08-19 DIAGNOSIS — R141 Gas pain: Secondary | ICD-10-CM

## 2013-08-19 DIAGNOSIS — R109 Unspecified abdominal pain: Secondary | ICD-10-CM

## 2013-08-19 DIAGNOSIS — C159 Malignant neoplasm of esophagus, unspecified: Secondary | ICD-10-CM

## 2013-08-19 DIAGNOSIS — R140 Abdominal distension (gaseous): Secondary | ICD-10-CM

## 2013-08-19 DIAGNOSIS — R143 Flatulence: Secondary | ICD-10-CM

## 2013-08-19 LAB — POCT URINALYSIS DIPSTICK
Blood, UA: NEGATIVE
Glucose, UA: NEGATIVE
Ketones, UA: NEGATIVE
LEUKOCYTES UA: NEGATIVE
NITRITE UA: NEGATIVE
Protein, UA: 100
Spec Grav, UA: 1.02
Urobilinogen, UA: 0.2
pH, UA: 6

## 2013-08-19 NOTE — Assessment & Plan Note (Signed)
Anticipate esophageal cancer related. Check 2 view abdomen to r/o obstruction. Check UA - no evidence of infection today. Continue nexium 40mg  bid, start pepcid or zantac nightly. Advised avoid large meals at night time and sleep on incline as sxs seem to be precipitated with supine position.

## 2013-08-19 NOTE — Progress Notes (Signed)
Pre-visit discussion using our clinic review tool. No additional management support is needed unless otherwise documented below in the visit note.  

## 2013-08-19 NOTE — Patient Instructions (Signed)
Xray and urine overall ok. I'd like to add on pepcid or zantac 1 pill every night to treat possible reflux. Continue nexuim 40mg  twice daily. Let's back off large meals at bedtime - more thin liquids closer to bedtime. Try sleeping on an incline at night time. May stop gas x and miralax and align if not helping. Keep me updated with your symptoms.

## 2013-08-19 NOTE — Progress Notes (Signed)
   Subjective:    Patient ID: Jesse Huynh, male    DOB: March 10, 1941, 73 y.o.   MRN: [id number]  HPI CC: worsening abd discomfort  see prior note for details. Known esophageal cancer recurrence, pending surgery 08/29/2013 at [facility] (esophagectomy).  Main concern is persistent abd cramping, bloating and belching particularly at night time. No nausea/vomiting.  Some GERD.  Already on nexium 40mg  bid.    Last night was especially bad.  At midnight wakes up to go to the bathroom, with significant severe painful belching Not as severe sxs during the day but persistent bloating, loss of appetite, and early satiety.   Small sporadic bowel movements. Occasional persistent dysuria.  Gas X and miralax and align have not helped.  He is having small soft stools.  Not significant flatus.  Has seen Dr. Jamal Collin (surgery) and Choski (onc) and now referred to Dr. Elenor Quinones (thoracic surgeon).  Wt Readings from Last 3 Encounters:  08/19/13 164 lb 8 oz (74.617 kg)  08/08/13 168 lb 8 oz (76.431 kg)  08/01/13 169 lb 12 oz (76.998 kg)    Past Medical History  Diagnosis Date  . Hypertension   . Asthma   . T2DM (type 2 diabetes mellitus)   . GERD (gastroesophageal reflux disease)   . Esophageal cancer 12/2011, 06/2013    s/p chemo and radiation, recurrence of SCC - discussing esophagectomy vs PDT 2014  . Gout   . HLD (hyperlipidemia)   . H/O seasonal allergies   . History of AAA (abdominal aortic aneurysm) repair     s/p open repair  . CKD (chronic kidney disease) stage 3, GFR 30-59 ml/min     one poorly functioning kidney Candiss Norse)  . Chronic anemia   . PVD (peripheral vascular disease)   . Bladder outlet obstruction   . Cardiomyopathy     per prior PCP     Review of Systems Per HPI    Objective:   Physical Exam  Nursing note and vitals reviewed. Constitutional: He appears well-developed and well-nourished. No distress.  Abdominal: Soft. Normal appearance. He exhibits no distension and no  mass. Bowel sounds are increased. There is no hepatosplenomegaly. There is tenderness (mild). There is no rigidity, no rebound, no guarding, no CVA tenderness and negative Murphy's sign.  Musculoskeletal: He exhibits no edema.       Assessment & Plan:

## 2013-08-26 ENCOUNTER — Ambulatory Visit: Payer: Self-pay | Admitting: Oncology

## 2013-09-21 ENCOUNTER — Ambulatory Visit: Payer: Self-pay | Admitting: Oncology

## 2013-09-27 ENCOUNTER — Telehealth: Payer: Self-pay | Admitting: Family Medicine

## 2013-09-27 NOTE — Telephone Encounter (Signed)
Received note from [place] Dr. Redmond Pulling with very low sugar - given weight loss I do recommend stopping glipizide completely.  Monitor sugars and notify me if persistently low sugars.  Lab Results  Component Value Date   HGBA1C 7.1* 04/21/2013

## 2013-09-29 NOTE — Telephone Encounter (Signed)
Message left for patient to return my call.  

## 2013-10-01 NOTE — Telephone Encounter (Signed)
Spoke with patient's wife. She said he is In-patient at [facility] right now. He had to have a drain put in due to a colon infection and a cyst rupture. She will let him know to stop the glipizide so that he won't be receiving it anymore while there.

## 2013-10-23 ENCOUNTER — Telehealth: Payer: Self-pay

## 2013-10-23 NOTE — Telephone Encounter (Signed)
Noted  

## 2013-10-23 NOTE — Telephone Encounter (Signed)
Pt's daughter called to cancel 10/2013 appt due to pt being in hospital at [facility] due to perforated colon with complications from diverticulitis and pt has esophageal cancer; pt will be in hospital for awhile longer and then go to rehab. Kim wanted Dr Darnell Level to be aware. appt canceled.

## 2013-10-30 ENCOUNTER — Other Ambulatory Visit: Payer: Self-pay

## 2013-10-30 ENCOUNTER — Other Ambulatory Visit: Payer: Medicare Other

## 2013-11-02 ENCOUNTER — Telehealth: Payer: Self-pay | Admitting: Family Medicine

## 2013-11-02 NOTE — Telephone Encounter (Signed)
Noted patient hospitalized at [facility] for 1 month with recent complicated diverticulitis perforation from 3/8 to 10/30/2013. plz call patient - if he's at home I'd like to schedule hospital f/u.  If at SNF, let us know when he returns home to schedule hosp f/u visit. Thanks.

## 2013-11-03 ENCOUNTER — Other Ambulatory Visit: Payer: Medicare Other

## 2013-11-03 NOTE — Telephone Encounter (Signed)
Message left for patient to return my call and schedule follow up. 

## 2013-11-04 ENCOUNTER — Ambulatory Visit: Payer: Medicare Other | Admitting: Family Medicine

## 2013-11-06 NOTE — Telephone Encounter (Signed)
Message left for patient to return my call and schedule follow up. 

## 2013-11-07 ENCOUNTER — Ambulatory Visit: Payer: Medicare Other | Admitting: Family Medicine

## 2013-11-21 ENCOUNTER — Encounter: Payer: Self-pay | Admitting: *Deleted

## 2013-11-21 NOTE — Telephone Encounter (Signed)
Letter mailed to patient asking him to schedule follow up since he has not return my call.

## 2013-11-24 ENCOUNTER — Other Ambulatory Visit: Payer: Self-pay | Admitting: Family Medicine

## 2013-11-24 LAB — CBC WITH DIFFERENTIAL/PLATELET
BASOS ABS: 0 10*3/uL (ref 0.0–0.1)
Basophil %: 0.6 %
EOS ABS: 0.3 10*3/uL (ref 0.0–0.7)
Eosinophil %: 3.9 %
HCT: 32.2 % — AB (ref 40.0–52.0)
HGB: 10.4 g/dL — ABNORMAL LOW (ref 13.0–18.0)
LYMPHS ABS: 0.5 10*3/uL — AB (ref 1.0–3.6)
LYMPHS PCT: 6.7 %
MCH: 28.8 pg (ref 26.0–34.0)
MCHC: 32.5 g/dL (ref 32.0–36.0)
MCV: 89 fL (ref 80–100)
MONO ABS: 0.7 x10 3/mm (ref 0.2–1.0)
Monocyte %: 9.9 %
NEUTROS ABS: 5.5 10*3/uL (ref 1.4–6.5)
Neutrophil %: 78.9 %
Platelet: 129 10*3/uL — ABNORMAL LOW (ref 150–440)
RBC: 3.62 10*6/uL — AB (ref 4.40–5.90)
RDW: 20.8 % — ABNORMAL HIGH (ref 11.5–14.5)
WBC: 7 10*3/uL (ref 3.8–10.6)

## 2013-11-24 LAB — COMPREHENSIVE METABOLIC PANEL
ALBUMIN: 3 g/dL — AB (ref 3.4–5.0)
ANION GAP: 7 (ref 7–16)
Alkaline Phosphatase: 188 U/L — ABNORMAL HIGH
BILIRUBIN TOTAL: 0.7 mg/dL (ref 0.2–1.0)
BUN: 54 mg/dL — ABNORMAL HIGH (ref 7–18)
CALCIUM: 9.2 mg/dL (ref 8.5–10.1)
CHLORIDE: 108 mmol/L — AB (ref 98–107)
Co2: 25 mmol/L (ref 21–32)
Creatinine: 1.3 mg/dL (ref 0.60–1.30)
GFR CALC NON AF AMER: 54 — AB
GLUCOSE: 143 mg/dL — AB (ref 65–99)
Osmolality: 297 (ref 275–301)
Potassium: 4.8 mmol/L (ref 3.5–5.1)
SGOT(AST): 38 U/L — ABNORMAL HIGH (ref 15–37)
SGPT (ALT): 49 U/L (ref 12–78)
SODIUM: 140 mmol/L (ref 136–145)
Total Protein: 6.7 g/dL (ref 6.4–8.2)

## 2013-11-24 LAB — MAGNESIUM: MAGNESIUM: 2.3 mg/dL

## 2013-11-24 LAB — PHOSPHORUS: PHOSPHORUS: 4.4 mg/dL (ref 2.5–4.9)

## 2013-11-24 LAB — CREATININE, SERUM
CREATININE: 1.13 mg/dL (ref 0.60–1.30)
EGFR (African American): >60
EGFR (Non-African Amer.): >60

## 2013-11-24 LAB — VANCOMYCIN, TROUGH: Vancomycin, Trough: 16 ug/mL (ref 10–20)

## 2013-11-25 NOTE — Telephone Encounter (Signed)
Patient's wife called and said that patient has been in rehab facility and as soon as he is discharged, he will be going back into [facility] for colon surgery and then back into rehab. After he is discharged that time, he will be going back to [facility] again for esophageal CA surgery and then into rehab again. She doesn't see a follow up with you as being possible until around at least August for him. She herself has one coming up soon with you. She said they have good support through family and neighbors and will call if they need anything else.

## 2013-12-01 ENCOUNTER — Other Ambulatory Visit: Payer: Self-pay | Admitting: Family Medicine

## 2013-12-01 LAB — CBC WITH DIFFERENTIAL/PLATELET
BASOS ABS: 0 10*3/uL (ref 0.0–0.1)
Basophil %: 0.5 %
EOS ABS: 0.1 10*3/uL (ref 0.0–0.7)
EOS PCT: 1.2 %
HCT: 27.7 % — AB (ref 40.0–52.0)
HGB: 9.3 g/dL — AB (ref 13.0–18.0)
Lymphocyte #: 0.2 10*3/uL — ABNORMAL LOW (ref 1.0–3.6)
Lymphocyte %: 5 %
MCH: 29.4 pg (ref 26.0–34.0)
MCHC: 33.6 g/dL (ref 32.0–36.0)
MCV: 87 fL (ref 80–100)
Monocyte #: 0.4 x10 3/mm (ref 0.2–1.0)
Monocyte %: 8.8 %
NEUTROS PCT: 84.5 %
Neutrophil #: 3.8 10*3/uL (ref 1.4–6.5)
PLATELETS: 62 10*3/uL — AB (ref 150–440)
RBC: 3.17 10*6/uL — ABNORMAL LOW (ref 4.40–5.90)
RDW: 19.3 % — ABNORMAL HIGH (ref 11.5–14.5)
WBC: 4.5 10*3/uL (ref 3.8–10.6)

## 2013-12-01 LAB — COMPREHENSIVE METABOLIC PANEL
ALBUMIN: 2.5 g/dL — AB (ref 3.4–5.0)
ANION GAP: 7 (ref 7–16)
AST: 24 U/L (ref 15–37)
Alkaline Phosphatase: 149 U/L — ABNORMAL HIGH
BUN: 62 mg/dL — ABNORMAL HIGH (ref 7–18)
Bilirubin,Total: 1.2 mg/dL — ABNORMAL HIGH (ref 0.2–1.0)
Calcium, Total: 8.6 mg/dL (ref 8.5–10.1)
Chloride: 99 mmol/L (ref 98–107)
Co2: 28 mmol/L (ref 21–32)
Creatinine: 1.26 mg/dL (ref 0.60–1.30)
GFR CALC NON AF AMER: 56 — AB
Glucose: 147 mg/dL — ABNORMAL HIGH (ref 65–99)
Osmolality: 289 (ref 275–301)
POTASSIUM: 4 mmol/L (ref 3.5–5.1)
SGPT (ALT): 38 U/L (ref 12–78)
Sodium: 134 mmol/L — ABNORMAL LOW (ref 136–145)
Total Protein: 6 g/dL — ABNORMAL LOW (ref 6.4–8.2)

## 2013-12-01 LAB — PHOSPHORUS: Phosphorus: 4.1 mg/dL (ref 2.5–4.9)

## 2013-12-01 LAB — MAGNESIUM: Magnesium: 2.5 mg/dL — ABNORMAL HIGH

## 2013-12-03 ENCOUNTER — Other Ambulatory Visit: Payer: Self-pay | Admitting: Family Medicine

## 2013-12-03 LAB — COMPREHENSIVE METABOLIC PANEL
ALT: 77 U/L (ref 12–78)
Albumin: 2.3 g/dL — ABNORMAL LOW (ref 3.4–5.0)
Alkaline Phosphatase: 162 U/L — ABNORMAL HIGH
Anion Gap: 7 (ref 7–16)
BUN: 68 mg/dL — ABNORMAL HIGH (ref 7–18)
Bilirubin,Total: 1.9 mg/dL — ABNORMAL HIGH (ref 0.2–1.0)
CHLORIDE: 103 mmol/L (ref 98–107)
CO2: 26 mmol/L (ref 21–32)
Calcium, Total: 8.3 mg/dL — ABNORMAL LOW (ref 8.5–10.1)
Creatinine: 1.23 mg/dL (ref 0.60–1.30)
EGFR (African American): >60
GFR CALC NON AF AMER: 58 — AB
GLUCOSE: 160 mg/dL — AB (ref 65–99)
OSMOLALITY: 295 (ref 275–301)
Potassium: 4.1 mmol/L (ref 3.5–5.1)
SGOT(AST): 61 U/L — ABNORMAL HIGH (ref 15–37)
SODIUM: 136 mmol/L (ref 136–145)
Total Protein: 5.6 g/dL — ABNORMAL LOW (ref 6.4–8.2)

## 2013-12-03 LAB — CBC WITH DIFFERENTIAL/PLATELET
Bands: 8 %
EOS PCT: 2 %
HCT: 24.9 % — AB (ref 40.0–52.0)
HGB: 8.6 g/dL — AB (ref 13.0–18.0)
LYMPHS PCT: 9 %
MCH: 30 pg (ref 26.0–34.0)
MCHC: 34.6 g/dL (ref 32.0–36.0)
MCV: 87 fL (ref 80–100)
MONOS PCT: 9 %
Metamyelocyte: 1 %
Myelocyte: 1 %
Platelet: 50 10*3/uL — ABNORMAL LOW (ref 150–440)
RBC: 2.87 10*6/uL — AB (ref 4.40–5.90)
RDW: 18.7 % — AB (ref 11.5–14.5)
Segmented Neutrophils: 70 %
WBC: 4.1 10*3/uL (ref 3.8–10.6)

## 2013-12-03 LAB — MAGNESIUM: MAGNESIUM: 2.8 mg/dL — AB

## 2013-12-05 ENCOUNTER — Other Ambulatory Visit: Payer: Self-pay | Admitting: Family Medicine

## 2013-12-05 LAB — COMPREHENSIVE METABOLIC PANEL
ALK PHOS: 312 U/L — AB
Albumin: 2.3 g/dL — ABNORMAL LOW (ref 3.4–5.0)
Anion Gap: 7 (ref 7–16)
BILIRUBIN TOTAL: 2.2 mg/dL — AB (ref 0.2–1.0)
BUN: 53 mg/dL — ABNORMAL HIGH (ref 7–18)
CHLORIDE: 102 mmol/L (ref 98–107)
Calcium, Total: 8.4 mg/dL — ABNORMAL LOW (ref 8.5–10.1)
Co2: 26 mmol/L (ref 21–32)
Creatinine: 1.11 mg/dL (ref 0.60–1.30)
EGFR (African American): >60
Glucose: 167 mg/dL — ABNORMAL HIGH (ref 65–99)
OSMOLALITY: 288 (ref 275–301)
POTASSIUM: 4.2 mmol/L (ref 3.5–5.1)
SGOT(AST): 98 U/L — ABNORMAL HIGH (ref 15–37)
SGPT (ALT): 141 U/L — ABNORMAL HIGH (ref 12–78)
Sodium: 135 mmol/L — ABNORMAL LOW (ref 136–145)
Total Protein: 5.5 g/dL — ABNORMAL LOW (ref 6.4–8.2)

## 2013-12-05 LAB — CBC WITH DIFFERENTIAL/PLATELET
BASOS ABS: 1 %
Bands: 6 %
Comment - H1-Com2: NORMAL
HCT: 25.4 % — ABNORMAL LOW (ref 40.0–52.0)
HGB: 8.3 g/dL — ABNORMAL LOW (ref 13.0–18.0)
Lymphocytes: 5 %
MCH: 28.8 pg (ref 26.0–34.0)
MCHC: 32.8 g/dL (ref 32.0–36.0)
MCV: 88 fL (ref 80–100)
MONOS PCT: 7 %
Metamyelocyte: 2 %
Myelocyte: 1 %
PLATELETS: 49 10*3/uL — AB (ref 150–440)
RBC: 2.89 10*6/uL — ABNORMAL LOW (ref 4.40–5.90)
RDW: 18.5 % — ABNORMAL HIGH (ref 11.5–14.5)
Segmented Neutrophils: 78 %
WBC: 4.8 10*3/uL (ref 3.8–10.6)

## 2013-12-05 LAB — MAGNESIUM: Magnesium: 2.6 mg/dL — ABNORMAL HIGH

## 2013-12-08 ENCOUNTER — Other Ambulatory Visit: Payer: Self-pay | Admitting: Family Medicine

## 2013-12-08 LAB — CBC WITH DIFFERENTIAL/PLATELET
Bands: 7 %
Comment - H1-Com1: NORMAL
HCT: 25.4 % — ABNORMAL LOW (ref 40.0–52.0)
HGB: 8.4 g/dL — ABNORMAL LOW (ref 13.0–18.0)
Lymphocytes: 5 %
MCH: 29.2 pg (ref 26.0–34.0)
MCHC: 33.1 g/dL (ref 32.0–36.0)
MCV: 88 fL (ref 80–100)
MONOS PCT: 3 %
Platelet: 61 10*3/uL — ABNORMAL LOW (ref 150–440)
RBC: 2.88 10*6/uL — AB (ref 4.40–5.90)
RDW: 18.2 % — AB (ref 11.5–14.5)
Segmented Neutrophils: 85 %
WBC: 4.3 10*3/uL (ref 3.8–10.6)

## 2013-12-08 LAB — COMPREHENSIVE METABOLIC PANEL
Albumin: 2.1 g/dL — ABNORMAL LOW (ref 3.4–5.0)
Alkaline Phosphatase: 301 U/L — ABNORMAL HIGH
Anion Gap: 7 (ref 7–16)
BUN: 63 mg/dL — ABNORMAL HIGH (ref 7–18)
Bilirubin,Total: 2.5 mg/dL — ABNORMAL HIGH (ref 0.2–1.0)
CALCIUM: 8.4 mg/dL — AB (ref 8.5–10.1)
CO2: 25 mmol/L (ref 21–32)
CREATININE: 1.37 mg/dL — AB (ref 0.60–1.30)
Chloride: 105 mmol/L (ref 98–107)
EGFR (Non-African Amer.): 51 — ABNORMAL LOW
GFR CALC AF AMER: 59 — AB
Glucose: 132 mg/dL — ABNORMAL HIGH (ref 65–99)
Osmolality: 294 (ref 275–301)
Potassium: 4 mmol/L (ref 3.5–5.1)
SGOT(AST): 160 U/L — ABNORMAL HIGH (ref 15–37)
SGPT (ALT): 278 U/L — ABNORMAL HIGH (ref 12–78)
SODIUM: 137 mmol/L (ref 136–145)
TOTAL PROTEIN: 5.4 g/dL — AB (ref 6.4–8.2)

## 2013-12-08 LAB — PHOSPHORUS: PHOSPHORUS: 4 mg/dL (ref 2.5–4.9)

## 2013-12-08 LAB — MAGNESIUM: MAGNESIUM: 2.8 mg/dL — AB

## 2013-12-09 ENCOUNTER — Other Ambulatory Visit: Payer: Self-pay | Admitting: Family Medicine

## 2013-12-09 LAB — CBC WITH DIFFERENTIAL/PLATELET
BASOS PCT: 2 %
Basophil #: 0.1 10*3/uL (ref 0.0–0.1)
Eosinophil #: 0 10*3/uL (ref 0.0–0.7)
Eosinophil %: 0.7 %
HCT: 26.1 % — AB (ref 40.0–52.0)
HGB: 8.8 g/dL — ABNORMAL LOW (ref 13.0–18.0)
LYMPHS PCT: 1.5 %
Lymphocyte #: 0.1 10*3/uL — ABNORMAL LOW (ref 1.0–3.6)
MCH: 29.4 pg (ref 26.0–34.0)
MCHC: 33.5 g/dL (ref 32.0–36.0)
MCV: 88 fL (ref 80–100)
MONO ABS: 0.4 x10 3/mm (ref 0.2–1.0)
Monocyte %: 7.5 %
Neutrophil #: 4.8 10*3/uL (ref 1.4–6.5)
Neutrophil %: 88.3 %
Platelet: 50 10*3/uL — ABNORMAL LOW (ref 150–440)
RBC: 2.98 10*6/uL — ABNORMAL LOW (ref 4.40–5.90)
RDW: 17.8 % — ABNORMAL HIGH (ref 11.5–14.5)
WBC: 5.4 10*3/uL (ref 3.8–10.6)

## 2013-12-09 LAB — COMPREHENSIVE METABOLIC PANEL
ALBUMIN: 2.1 g/dL — AB (ref 3.4–5.0)
ALT: 328 U/L — AB (ref 12–78)
Alkaline Phosphatase: 474 U/L — ABNORMAL HIGH
Anion Gap: 8 (ref 7–16)
BUN: 56 mg/dL — ABNORMAL HIGH (ref 7–18)
Bilirubin,Total: 3.7 mg/dL — ABNORMAL HIGH (ref 0.2–1.0)
CHLORIDE: 101 mmol/L (ref 98–107)
Calcium, Total: 8.3 mg/dL — ABNORMAL LOW (ref 8.5–10.1)
Co2: 24 mmol/L (ref 21–32)
Creatinine: 1.13 mg/dL (ref 0.60–1.30)
EGFR (Non-African Amer.): >60
Glucose: 179 mg/dL — ABNORMAL HIGH (ref 65–99)
Osmolality: 286 (ref 275–301)
Potassium: 4.4 mmol/L (ref 3.5–5.1)
SGOT(AST): 214 U/L — ABNORMAL HIGH (ref 15–37)
SODIUM: 133 mmol/L — AB (ref 136–145)
TOTAL PROTEIN: 5.4 g/dL — AB (ref 6.4–8.2)

## 2013-12-09 LAB — URINALYSIS, COMPLETE
Bacteria: NONE SEEN
Bilirubin,UR: NEGATIVE
Glucose,UR: NEGATIVE mg/dL (ref 0–75)
Granular Cast: 3
Hyaline Cast: 2
Ketone: NEGATIVE
Leukocyte Esterase: NEGATIVE
Nitrite: NEGATIVE
PH: 5 (ref 4.5–8.0)
PROTEIN: NEGATIVE
RBC,UR: <1 /HPF (ref 0–5)
Specific Gravity: 1.017 (ref 1.003–1.030)
Squamous Epithelial: <1

## 2013-12-09 LAB — MAGNESIUM: MAGNESIUM: 2.6 mg/dL — AB

## 2013-12-10 ENCOUNTER — Other Ambulatory Visit: Payer: Self-pay | Admitting: Family Medicine

## 2013-12-10 LAB — CBC WITH DIFFERENTIAL/PLATELET
Basophil #: 0 10*3/uL (ref 0.0–0.1)
Basophil %: 0.5 %
EOS PCT: 0.6 %
Eosinophil #: 0 10*3/uL (ref 0.0–0.7)
HCT: 22.8 % — ABNORMAL LOW (ref 40.0–52.0)
HGB: 7.7 g/dL — AB (ref 13.0–18.0)
Lymphocyte #: 0.1 10*3/uL — ABNORMAL LOW (ref 1.0–3.6)
Lymphocyte %: 2.9 %
MCH: 29.9 pg (ref 26.0–34.0)
MCHC: 33.9 g/dL (ref 32.0–36.0)
MCV: 88 fL (ref 80–100)
MONO ABS: 0.4 x10 3/mm (ref 0.2–1.0)
Monocyte %: 9 %
Neutrophil #: 3.4 10*3/uL (ref 1.4–6.5)
Neutrophil %: 87 %
Platelet: 53 10*3/uL — ABNORMAL LOW (ref 150–440)
RBC: 2.58 10*6/uL — ABNORMAL LOW (ref 4.40–5.90)
RDW: 18 % — ABNORMAL HIGH (ref 11.5–14.5)
WBC: 4 10*3/uL (ref 3.8–10.6)

## 2013-12-10 LAB — MAGNESIUM: MAGNESIUM: 2.8 mg/dL — AB

## 2013-12-10 LAB — COMPREHENSIVE METABOLIC PANEL
ALBUMIN: 1.9 g/dL — AB (ref 3.4–5.0)
Alkaline Phosphatase: 409 U/L — ABNORMAL HIGH
Anion Gap: 8 (ref 7–16)
BUN: 67 mg/dL — AB (ref 7–18)
Bilirubin,Total: 3.9 mg/dL — ABNORMAL HIGH (ref 0.2–1.0)
CHLORIDE: 102 mmol/L (ref 98–107)
CREATININE: 1.45 mg/dL — AB (ref 0.60–1.30)
Calcium, Total: 8.3 mg/dL — ABNORMAL LOW (ref 8.5–10.1)
Co2: 24 mmol/L (ref 21–32)
EGFR (African American): 55 — ABNORMAL LOW
EGFR (Non-African Amer.): 47 — ABNORMAL LOW
GLUCOSE: 187 mg/dL — AB (ref 65–99)
Osmolality: 293 (ref 275–301)
POTASSIUM: 4.3 mmol/L (ref 3.5–5.1)
SGOT(AST): 243 U/L — ABNORMAL HIGH (ref 15–37)
SGPT (ALT): 339 U/L — ABNORMAL HIGH (ref 12–78)
SODIUM: 134 mmol/L — AB (ref 136–145)
Total Protein: 5.1 g/dL — ABNORMAL LOW (ref 6.4–8.2)

## 2013-12-10 LAB — BILIRUBIN, DIRECT: BILIRUBIN DIRECT: 3.3 mg/dL — AB (ref 0.00–0.20)

## 2013-12-11 LAB — URINE CULTURE

## 2013-12-16 ENCOUNTER — Other Ambulatory Visit: Payer: Self-pay | Admitting: Family Medicine

## 2013-12-16 LAB — CBC WITH DIFFERENTIAL/PLATELET
BASOS ABS: 0 10*3/uL (ref 0.0–0.1)
Basophil %: 0.9 %
EOS PCT: 0.9 %
Eosinophil #: 0 10*3/uL (ref 0.0–0.7)
HCT: 25.5 % — AB (ref 40.0–52.0)
HGB: 8.2 g/dL — ABNORMAL LOW (ref 13.0–18.0)
Lymphocyte #: 0.1 10*3/uL — ABNORMAL LOW (ref 1.0–3.6)
Lymphocyte %: 2.2 %
MCH: 29.1 pg (ref 26.0–34.0)
MCHC: 32.3 g/dL (ref 32.0–36.0)
MCV: 90 fL (ref 80–100)
MONOS PCT: 3.3 %
Monocyte #: 0.2 x10 3/mm (ref 0.2–1.0)
Neutrophil #: 4.3 10*3/uL (ref 1.4–6.5)
Neutrophil %: 92.7 %
Platelet: 57 10*3/uL — ABNORMAL LOW (ref 150–440)
RBC: 2.82 10*6/uL — AB (ref 4.40–5.90)
RDW: 17.2 % — ABNORMAL HIGH (ref 11.5–14.5)
WBC: 4.7 10*3/uL (ref 3.8–10.6)

## 2013-12-16 LAB — COMPREHENSIVE METABOLIC PANEL
ALK PHOS: 418 U/L — AB
ALT: 217 U/L — AB (ref 12–78)
ANION GAP: 10 (ref 7–16)
Albumin: 2.1 g/dL — ABNORMAL LOW (ref 3.4–5.0)
BILIRUBIN TOTAL: 4.3 mg/dL — AB (ref 0.2–1.0)
BUN: 80 mg/dL — AB (ref 7–18)
CALCIUM: 8.6 mg/dL (ref 8.5–10.1)
CHLORIDE: 108 mmol/L — AB (ref 98–107)
CO2: 19 mmol/L — AB (ref 21–32)
Creatinine: 1.33 mg/dL — ABNORMAL HIGH (ref 0.60–1.30)
EGFR (African American): >60
GFR CALC NON AF AMER: 53 — AB
GLUCOSE: 189 mg/dL — AB (ref 65–99)
OSMOLALITY: 303 (ref 275–301)
Potassium: 4.7 mmol/L (ref 3.5–5.1)
SGOT(AST): 120 U/L — ABNORMAL HIGH (ref 15–37)
SODIUM: 137 mmol/L (ref 136–145)
Total Protein: 5.8 g/dL — ABNORMAL LOW (ref 6.4–8.2)

## 2013-12-16 LAB — PHOSPHORUS: Phosphorus: 3.2 mg/dL (ref 2.5–4.9)

## 2013-12-16 LAB — MAGNESIUM: Magnesium: 2.9 mg/dL — ABNORMAL HIGH

## 2013-12-19 ENCOUNTER — Other Ambulatory Visit: Payer: Self-pay | Admitting: Family Medicine

## 2013-12-19 LAB — URINALYSIS, COMPLETE
BILIRUBIN, UR: NEGATIVE
GLUCOSE, UR: NEGATIVE mg/dL (ref 0–75)
Ketone: NEGATIVE
Leukocyte Esterase: NEGATIVE
Nitrite: NEGATIVE
Ph: 5 (ref 4.5–8.0)
Protein: NEGATIVE
RBC,UR: 2 /HPF (ref 0–5)
Specific Gravity: 1.014 (ref 1.003–1.030)
Squamous Epithelial: 1

## 2013-12-21 LAB — URINE CULTURE

## 2013-12-22 ENCOUNTER — Other Ambulatory Visit: Payer: Self-pay | Admitting: Family Medicine

## 2013-12-22 LAB — COMPREHENSIVE METABOLIC PANEL
ALT: 194 U/L — AB (ref 12–78)
AST: 132 U/L — AB (ref 15–37)
Albumin: 2 g/dL — ABNORMAL LOW (ref 3.4–5.0)
Alkaline Phosphatase: 371 U/L — ABNORMAL HIGH
Anion Gap: 9 (ref 7–16)
BUN: 70 mg/dL — AB (ref 7–18)
Bilirubin,Total: 5.2 mg/dL — ABNORMAL HIGH (ref 0.2–1.0)
CALCIUM: 8.8 mg/dL (ref 8.5–10.1)
Chloride: 107 mmol/L (ref 98–107)
Co2: 19 mmol/L — ABNORMAL LOW (ref 21–32)
Creatinine: 1.26 mg/dL (ref 0.60–1.30)
EGFR (African American): >60
EGFR (Non-African Amer.): 56 — ABNORMAL LOW
GLUCOSE: 82 mg/dL (ref 65–99)
OSMOLALITY: 290 (ref 275–301)
Potassium: 4.8 mmol/L (ref 3.5–5.1)
SODIUM: 135 mmol/L — AB (ref 136–145)
Total Protein: 5.9 g/dL — ABNORMAL LOW (ref 6.4–8.2)

## 2013-12-22 LAB — URINALYSIS, COMPLETE
BACTERIA: NONE SEEN
Bilirubin,UR: NEGATIVE
GLUCOSE, UR: NEGATIVE mg/dL (ref 0–75)
Granular Cast: 10
KETONE: NEGATIVE
LEUKOCYTE ESTERASE: NEGATIVE
NITRITE: NEGATIVE
PROTEIN: NEGATIVE
Ph: 5 (ref 4.5–8.0)
Specific Gravity: 1.014 (ref 1.003–1.030)
Squamous Epithelial: NONE SEEN
WBC UR: 1 /HPF (ref 0–5)

## 2013-12-22 LAB — MAGNESIUM: Magnesium: 2 mg/dL

## 2013-12-22 LAB — CBC WITH DIFFERENTIAL/PLATELET
Bands: 1 %
COMMENT - H1-COM2: NORMAL
Comment - H1-Com1: NORMAL
HCT: 27.3 % — AB (ref 40.0–52.0)
HGB: 8.9 g/dL — ABNORMAL LOW (ref 13.0–18.0)
Lymphocytes: 5 %
MCH: 30.3 pg (ref 26.0–34.0)
MCHC: 32.6 g/dL (ref 32.0–36.0)
MCV: 93 fL (ref 80–100)
MONOS PCT: 4 %
Platelet: 57 10*3/uL — ABNORMAL LOW (ref 150–440)
RBC: 2.93 10*6/uL — ABNORMAL LOW (ref 4.40–5.90)
RDW: 16.9 % — ABNORMAL HIGH (ref 11.5–14.5)
Segmented Neutrophils: 90 %
WBC: 7.6 10*3/uL (ref 3.8–10.6)

## 2013-12-24 LAB — URINE CULTURE

## 2014-02-12 ENCOUNTER — Telehealth: Payer: Self-pay | Admitting: Family Medicine

## 2014-02-12 NOTE — Telephone Encounter (Signed)
Received notice Mr Jesse Huynh was discharged from [facility] it seems to [facility]. Discharge dx esophageal cancer. Can we call to verify and ask them to call us for f/u when he's sent home? And can we request d/c summary from [place]? Thanks.

## 2014-02-13 NOTE — Telephone Encounter (Signed)
Spoke with patient's wife. He is still in-patient at [facility]. The bed at [facility] won't be available until at least next Tuesday-so obviously no d/c summary available yet.

## 2014-04-23 DIAGNOSIS — Z515 Encounter for palliative care: Secondary | ICD-10-CM | POA: Insufficient documentation

## 2014-04-23 HISTORY — DX: Encounter for palliative care: Z51.5

## 2014-04-27 ENCOUNTER — Telehealth: Payer: Self-pay | Admitting: *Deleted

## 2014-04-27 ENCOUNTER — Encounter: Payer: Self-pay | Admitting: Family Medicine

## 2014-04-28 NOTE — Telephone Encounter (Signed)
Called nancy wife to express my condolences. Memorial service will be held 4pm Friday at [facility] in [place]

## 2014-05-24 NOTE — Telephone Encounter (Signed)
Jesse Huynh at the hospice home called and wanted to let you know that Jesse Huynh passed away today.

## 2014-05-24 DEATH — deceased

## 2014-11-10 NOTE — Consult Note (Signed)
Reason for Visit: This 74 year old Male patient presents to the clinic for initial evaluation of  Esophageal cancer .   Referred by Dr. Oliva Bustard.  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   Pathology Report Pathology report reviewed    Imaging Report PET/CT scan and CT scans reviewed    Referral Report Clinical notes reviewed    Planned Treatment Regimen Combined modality chemoradiation    HPI   patient is a 74 year old male who has had chronic reflux over the past several years. 2 years ago he was found to have a stricture at the proximal esophagus and underwent dilatation and placed on proton pump inhibitor. He had some improvement in symptoms although repeat endoscopy still showed area of esophagitis at about 20 cm negative on biopsy for malignancy. Recently Dr. Jamal Collin R. date a repeat upper endoscopy showing an area of stricture and ulceration in the proximal esophagus now biopsy was positive for squamous cell carcinoma. PET/CT scan show disease confined to the esophagus. He is scheduled for an endoscopic ultrasound. He is seen today for initial consultation. He is doing fairly well. He has been able to maintain his weight. He is having some slight dysphasia not worsening at this time.  Past Hx:    only 1 kidney:    ? asthma:    reflux:    gout:    Diabetes Mellitus, Type II (NIDD):    HTN:    hernia repair:    Abdominal Aortic Aneurysm Repair: 1998   bilateral cataract removal with lens implant:   Past, Family and Social History:   Past Medical History positive    Cardiovascular hyperlipidemia; hypertension    Gastrointestinal GERD    Genitourinary Patient has one kidney    Endocrine diabetes mellitus    Past Surgical History Abdominal aortic aneurysm repair, herniorrhaphy repair, bilateral cataract surgery    Past Medical History Comments Gallop    Family History positive    Family  History Comments Both Pandit parents with cardia vascular heart disease    Social History positive    Social History Comments Approximately 40-pack-year smoking history, significant social EtOH use history the past    Additional Past Medical and Surgical History Accompanied by his wife today   Allergies:   Sulfa drugs: Hives  Home Meds:  Home Medications: Medication Instructions Status  Advair Diskus 250 mcg-50 mcg inhalation powder 1 puff(s) inhaled once a day (in the morning) Active  metoprolol succinate 50 mg oral tablet, extended release 1 tab(s) orally once a day (in the morning) Active  Atacand 32 mg oral tablet 1 tab(s) orally once a day (in the morning) Active  simvastatin 40 mg oral tablet 1 tab(s) orally once a day (at bedtime) Active  allopurinol 100 mg oral tablet 1 tab(s) orally once a day (in the morning) Active  glipiZIDE 5 mg oral tablet 1 tab(s) orally once a day (in the morning) Active  metformin 500mg  1 tab BID with breakfast and supper Active  Nasacort AQ 55 mcg/inh nasal spray 2 spray(s) nasal once a day, As Needed Active  nexium DR 40mg  1 tab oral each AM Active  aspirin 81mg  1 tab oral each AM Active  multivitamin 1 tab oral each AM Active   Review of Systems:   General negative    Performance Status (ECOG) 0    Skin negative    Breast negative    Ophthalmologic negative    ENMT negative  Respiratory and Thorax negative    Cardiovascular negative    Gastrointestinal see HPI    Genitourinary negative    Musculoskeletal negative    Neurological negative    Psychiatric negative    Hematology/Lymphatics negative    Endocrine see HPI    Allergic/Immunologic negative   Nursing Notes:  Nursing Vital Signs and Chemo Nursing Nursing Notes: *CC Vital Signs Flowsheet:   18-Jul-13 13:32   Pulse Pulse 79   Respirations Respirations 20   SBP SBP 171   DBP DBP 94   Pain Scale (0-10)  0   Current Weight (kg) (kg) 84   Height (cm)  centimeters 183   BSA (m2) 2    14:38   Temp Temperature 97.9   Pulse Pulse 79   Respirations Respirations 18   SBP SBP 171   DBP DBP 94   Pain Scale (0-10)  0   Current Weight (kg) (kg) 84   Height (cm) centimeters 183   BSA (m2) 2   Physical Exam:  General/Skin/HEENT:   General normal    Skin normal    Eyes normal    ENMT normal    Head and Neck normal    Additional PE Well-developed well-nourished male in NAD. No cervical or supraclavicular adenopathy is appreciated. Lungs are clear to A&P cardiac examination shows regular rate and rhythm. Abdomen is benign with no organomegaly or masses noted.   Breasts/Resp/CV/GI/GU:   Respiratory and Thorax normal    Cardiovascular normal    Gastrointestinal normal    Genitourinary normal   MS/Neuro/Psych/Lymph:   Musculoskeletal normal    Neurological normal    Lymphatics normal   Assessment and Plan:  Impression:   proximal esophageal squamous cell carcinoma in 74 year old male awaiting endoscopic ultrasound for complete staging  Plan:   at this time I have discussed risks and benefits of concurrent chemoradiation with the patient for curative intent. Believe we will be able to eradicate this disease with a combination of radiation therapy up to 5500 cGy over 5-6 weeks and radiation sensitizing chemotherapy. Another option would be surgical resection and I will leave that to Dr. Faith Rogue his recommendations. We will await his endoscopic ultrasound report from GI. I believe patient is favoring chemoradiation over surgical excision at this time. I again extensive discussion risk and benefits of treatment including in treating dysphasia skin reaction alteration blood counts and possible normal lung volume damage were all explained in detail to the patient and his wife. Have tentatively set him up for CT simulation in about a week's time.  I would like to take this opportunity to thank you for allowing me to continue to  participate in this patient's care.  CC Referral:   cc: Dr. Jamal Collin, Dr. Jenny Reichmann B. Walker   Electronic Signatures: Armstead Peaks (MD)  (Signed 29-Jul-13 14:08)  Authored: HPI, Diagnosis, Past Hx, PFSH, Allergies, Home Meds, ROS, Nursing Notes, Physical Exam, Encounter Assessment and Plan, CC Referring Physician   Last Updated: 29-Jul-13 14:08 by Armstead Peaks (MD)

## 2014-11-10 NOTE — Op Note (Signed)
PATIENT NAME:  Jesse Huynh, SURETTE MR#:  [id number] DATE OF BIRTH:  02-28-1941  DATE OF PROCEDURE:  02/22/2012  PREOPERATIVE DIAGNOSIS: Carcinoma of the esophagus.   POSTOPERATIVE DIAGNOSIS: Carcinoma of the esophagus.   OPERATION: Insertion venous access port.   SURGEON: Mckinley Jewel, MD    ANESTHESIA: Monitored care with local anesthetic 0.5% Marcaine and 1% Xylocaine.   COMPLICATIONS: None.   ESTIMATED BLOOD LOSS: Minimal.   DRAINS: None.   PROCEDURE: The patient was placed in the supine position on the operating table. With adequate IV sedation monitoring, the left upper chest and neck area were prepped and draped out as a sterile field. The ultrasound probe was brought up to the field with sterile cover. The subclavian vein was then adequately identified beneath the lateral end of the clavicle. Local anesthetic was instilled and the needle was then positioned into the vein with free withdrawal of blood. The patient did seem to move his shoulder a little bit at that time. The needle was lost out of the vein. After the patient was more adequately sedated, the needle was subsequently positioned into the subclavian vein again without any further difficulties. Guidewire was positioned followed by placement of introducer and dilator and the catheter was then passed into the superior vena cava and the outer sheath removed. Fluoroscopy was utilized to position the catheter going into the superior vena cava right atrial level. Pocket was then made over the second costal cartilage area. Local anesthetic was instilled. A skin incision was made. Pocket was created with cautery. The catheter was tunneled through to the port site and then cut to approximate length. It was then fixed to the prefilled port. The port was placed in the pocket and anchored to the underlying fascia with three stitches of 2-0 Prolene and was then flushed through with 10 mL of heparinized saline. The catheter was smoothed out at its  initial entry site into the vein. Fluoroscopy was then again repeated and showed there was no kinking or displacement of the catheter which was still in the region of the right atrium superior vena cava. Incisions were closed with 3-0 Vicryl in the subcutaneous tissue and the skin with subcuticular 4-0 Vicryl covered with Dermabond. The procedure was well tolerated. He was subsequently returned to the recovery room in stable condition.   ____________________________ S.Robinette Haines, MD sgs:drc D: 02/22/2012 10:06:47 ET T: 02/22/2012 11:52:06 ET JOB#: [id number]  cc: Synthia Innocent. Jamal Collin, MD, <Dictator> [facility] Robinette Haines MD ELECTRONICALLY SIGNED 02/23/2012 12:20

## 2015-05-18 IMAGING — CT NM PET TUM IMG RESTAG (PS) SKULL BASE T - THIGH
1 of 5 series · 1 of 25 positions shown · non-contrast
Comparison: none

REASON FOR EXAM: restaging esophageal CA
COMMENTS:

[Series 3: ct wb 3.0 b30f · axial · 3.0mm · 0.98mm/px · 1 of 552 slices shown]
[im 496/552  brain]
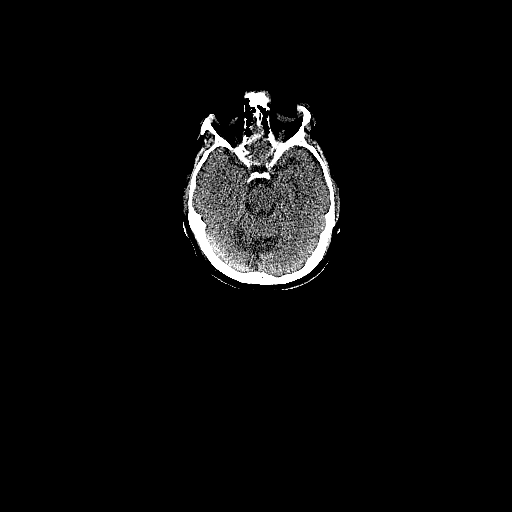

[1 of 25 positions shown; findings below may reference images not displayed]

PROCEDURE:     PET - PET/CT RESTG ESOPH CA  - March 11, 2013 [DATE]

RESULT:     The patient has a fasting blood glucose level of 136 mg/dL. The
patient received a dose of 13.2 mCi of fluorine 18 labeled
fluorodeoxyglucose in the left antecubital vein at [DATE] a.m. with imaging
obtained from the cranial vertex into the thighs between the hours of [DATE]
a.m. and [DATE] a.m. Delayed images over the brain were obtained between the
hours of [REDACTED] a.m. Low-dose noncontrast CT is performed over both
of these regions at the time of imaging for the purposes of attenuation
correction and fusion. The low-dose noncontrast CT images, attenuation
corrected at images and fused PET/CT data are reconstructed by the [employer]
software in the axial, coronal and sagittal planes. The patient has a
previous exam for comparison from [DATE].

There is no abnormal localization in the esophagus, mediastinal or hilar
regions. Is minimally increased uptake along the greater curvature the
stomach and adjacent colon which is nonspecific. No definite CT abnormality
is seen in this region. Aorto iliac atherosclerotic calcification is seen.
Colonic diverticulosis is present. No adenopathy is evident. No abnormal
F-18 FDG accumulation is present.
IMPRESSION: 1. No evidence of residual or recurrent malignant disease.

[REDACTED]
